# Patient Record
Sex: Female | Born: 1950 | Race: Black or African American | Hispanic: No | Smoking: Never smoker
Health system: Southern US, Community
[De-identification: ages and names within clinical notes are randomized; demographics above are authoritative.]

## PROBLEM LIST (undated history)

## (undated) DIAGNOSIS — J45909 Unspecified asthma, uncomplicated: Secondary | ICD-10-CM

## (undated) DIAGNOSIS — H409 Unspecified glaucoma: Secondary | ICD-10-CM

## (undated) DIAGNOSIS — I10 Essential (primary) hypertension: Secondary | ICD-10-CM

## (undated) DIAGNOSIS — E119 Type 2 diabetes mellitus without complications: Secondary | ICD-10-CM

## (undated) HISTORY — PX: ABDOMINAL HYSTERECTOMY: SHX81

## (undated) HISTORY — DX: Essential (primary) hypertension: I10

## (undated) HISTORY — DX: Unspecified glaucoma: H40.9

## (undated) HISTORY — PX: BACK SURGERY: SHX140

## (undated) HISTORY — PX: FOOT SURGERY: SHX648

## (undated) HISTORY — PX: TONSILLECTOMY: SUR1361

## (undated) HISTORY — DX: Unspecified asthma, uncomplicated: J45.909

## (undated) HISTORY — PX: NECK SURGERY: SHX720

## (undated) HISTORY — DX: Type 2 diabetes mellitus without complications: E11.9

---

## 2002-03-15 ENCOUNTER — Ambulatory Visit (HOSPITAL_COMMUNITY)
Admission: RE | Admit: 2002-03-15 | Discharge: 2002-03-15 | Payer: Self-pay | Admitting: Physical Medicine and Rehabilitation

## 2002-03-15 ENCOUNTER — Encounter: Payer: Self-pay | Admitting: Physical Medicine and Rehabilitation

## 2013-09-23 ENCOUNTER — Ambulatory Visit (INDEPENDENT_AMBULATORY_CARE_PROVIDER_SITE_OTHER): Payer: Medicare Other | Admitting: Podiatrist

## 2013-09-23 ENCOUNTER — Encounter: Payer: Self-pay | Admitting: Podiatrist

## 2013-09-23 VITALS — BP 122/58 | HR 78 | Resp 17 | Ht 64.0 in | Wt 242.0 lb

## 2013-09-23 DIAGNOSIS — E1140 Type 2 diabetes mellitus with diabetic neuropathy, unspecified: Secondary | ICD-10-CM

## 2013-09-23 DIAGNOSIS — E1142 Type 2 diabetes mellitus with diabetic polyneuropathy: Secondary | ICD-10-CM

## 2013-09-23 DIAGNOSIS — M79609 Pain in unspecified limb: Secondary | ICD-10-CM

## 2013-09-23 DIAGNOSIS — M216X9 Other acquired deformities of unspecified foot: Secondary | ICD-10-CM

## 2013-09-23 DIAGNOSIS — E1149 Type 2 diabetes mellitus with other diabetic neurological complication: Secondary | ICD-10-CM

## 2013-09-23 DIAGNOSIS — M21372 Foot drop, left foot: Secondary | ICD-10-CM

## 2013-09-23 DIAGNOSIS — Q828 Other specified congenital malformations of skin: Secondary | ICD-10-CM

## 2013-09-23 DIAGNOSIS — B351 Tinea unguium: Secondary | ICD-10-CM

## 2013-09-23 NOTE — Progress Notes (Signed)
   Subjective:    Patient ID: Christine GianottiLinda H Siordia, female    DOB: February 05, 1951, 63 y.o.   MRN: [id number]  HPI Comments: Pt presents for debridement of B/L 1 - 5 toenails, and hard skin B/L heels and left 5th lateral MPJ, and diabetic foot evaluation. Patient was recently dignosed with diabetes- diet controlled at this time.  She has a foot drop on the left due to a previous surgical complication.  She uses a cane but does not use any assistive footdrop bracing or devices    Review of Systems  HENT: Positive for sinus pressure and tinnitus.   Endocrine: Positive for polyuria.  Musculoskeletal: Positive for back pain.  All other systems reviewed and are negative.      Objective:   Physical Exam GENERAL APPEARANCE: Alert, conversant. Appropriately groomed. No acute distress.  VASCULAR: Pedal pulses palpable at 2/4 DP and PT bilateral.  Capillary refill time is immediate to all digits,  Proximal to distal cooling it warm to warm.  Digital hair growth is present bilateral  NEUROLOGIC: sensation is decreased epicritically and protectively to 5.07 monofilament at 4/5 sites right at 2/5 sites left.  Light touch is intact bilateral, vibratory sensation absent bilateral, achilles tendon reflex is intact bilateral.  MUSCULOSKELETAL: decreased muscle strength is present at 3/5 left with dorsiflexion, plantar flexion, inversion and eversion.  The right has acceptable muscle strength, tone and stability.  Intrinsic muscluature intact bilateral.   DERMATOLOGIC: patients toenails are elongated and she relates they grow into her skin.  She has incurvated borders with pain. skin color, texture, and turger are within normal limits. + preulcerative lesions are present submetatarsal 5 left due to the foot drop, no interdigital maceration noted.  No open lesions present.  Digital nails are asymptomatic.     Assessment & Plan:  Diabetes-- diet controlled;  Ingrowing nail deformity, foot drop,  Plantarflexed 5th  metatarsal right, porokeratosis x 1 Plan:  Nails and lesion debrided sharply without complication. Recommended a spring brace and diabetic shoes with inserts.  She will be seen back in 3 months or as needed for follow up

## 2013-09-23 NOTE — Patient Instructions (Signed)
Diabetes and Foot Care Diabetes may cause you to have problems because of poor blood supply (circulation) to your feet and legs. This may cause the skin on your feet to become thinner, break easier, and heal more slowly. Your skin may become dry, and the skin may peel and crack. You may also have nerve damage in your legs and feet causing decreased feeling in them. You may not notice minor injuries to your feet that could lead to infections or more serious problems. Taking care of your feet is one of the most important things you can do for yourself.  HOME CARE INSTRUCTIONS  Wear shoes at all times, even in the house. Do not go barefoot. Bare feet are easily injured.  Check your feet daily for blisters, cuts, and redness. If you cannot see the bottom of your feet, use a mirror or ask someone for help.  Wash your feet with warm water (do not use hot water) and mild soap. Then pat your feet and the areas between your toes until they are completely dry. Do not soak your feet as this can dry your skin.  Apply a moisturizing lotion or petroleum jelly (that does not contain alcohol and is unscented) to the skin on your feet and to dry, brittle toenails. Do not apply lotion between your toes.  Trim your toenails straight across. Do not dig under them or around the cuticle. File the edges of your nails with an emery board or nail file.  Do not cut corns or calluses or try to remove them with medicine.  Wear clean socks or stockings every day. Make sure they are not too tight. Do not wear knee-high stockings since they may decrease blood flow to your legs.  Wear shoes that fit properly and have enough cushioning. To break in new shoes, wear them for just a few hours a day. This prevents you from injuring your feet. Always look in your shoes before you put them on to be sure there are no objects inside.  Do not cross your legs. This may decrease the blood flow to your feet.  If you find a minor scrape,  cut, or break in the skin on your feet, keep it and the skin around it clean and dry. These areas may be cleansed with mild soap and water. Do not cleanse the area with peroxide, alcohol, or iodine.  When you remove an adhesive bandage, be sure not to damage the skin around it.  If you have a wound, look at it several times a day to make sure it is healing.  Do not use heating pads or hot water bottles. They may burn your skin. If you have lost feeling in your feet or legs, you may not know it is happening until it is too late.  Make sure your health care provider performs a complete foot exam at least annually or more often if you have foot problems. Report any cuts, sores, or bruises to your health care provider immediately. SEEK MEDICAL CARE IF:   You have an injury that is not healing.  You have cuts or breaks in the skin.  You have an ingrown nail.  You notice redness on your legs or feet.  You feel burning or tingling in your legs or feet.  You have pain or cramps in your legs and feet.  Your legs or feet are numb.  Your feet always feel cold. SEEK IMMEDIATE MEDICAL CARE IF:   There is increasing redness,   swelling, or pain in or around a wound.  There is a red line that goes up your leg.  Pus is coming from a wound.  You develop a fever or as directed by your health care provider.  You notice a bad smell coming from an ulcer or wound. Document Released: 05/24/2000 Document Revised: 01/27/2013 Document Reviewed: 11/03/2012 [employer] Patient Information 2014 [employer].  

## 2014-01-06 ENCOUNTER — Ambulatory Visit (INDEPENDENT_AMBULATORY_CARE_PROVIDER_SITE_OTHER): Payer: Medicare Other | Admitting: Podiatrist

## 2014-01-06 ENCOUNTER — Encounter: Payer: Self-pay | Admitting: Podiatrist

## 2014-01-06 VITALS — BP 143/69 | HR 90 | Resp 16 | Ht 64.0 in | Wt 244.0 lb

## 2014-01-06 DIAGNOSIS — M79609 Pain in unspecified limb: Secondary | ICD-10-CM

## 2014-01-06 DIAGNOSIS — M21372 Foot drop, left foot: Secondary | ICD-10-CM

## 2014-01-06 DIAGNOSIS — M216X2 Other acquired deformities of left foot: Secondary | ICD-10-CM

## 2014-01-06 DIAGNOSIS — S8010XA Contusion of unspecified lower leg, initial encounter: Secondary | ICD-10-CM

## 2014-01-06 DIAGNOSIS — E1140 Type 2 diabetes mellitus with diabetic neuropathy, unspecified: Secondary | ICD-10-CM

## 2014-01-06 DIAGNOSIS — E1142 Type 2 diabetes mellitus with diabetic polyneuropathy: Secondary | ICD-10-CM

## 2014-01-06 DIAGNOSIS — B351 Tinea unguium: Secondary | ICD-10-CM

## 2014-01-06 DIAGNOSIS — E1149 Type 2 diabetes mellitus with other diabetic neurological complication: Secondary | ICD-10-CM

## 2014-01-06 DIAGNOSIS — S8012XA Contusion of left lower leg, initial encounter: Secondary | ICD-10-CM

## 2014-01-06 DIAGNOSIS — Q828 Other specified congenital malformations of skin: Secondary | ICD-10-CM

## 2014-01-06 DIAGNOSIS — M216X9 Other acquired deformities of unspecified foot: Secondary | ICD-10-CM

## 2014-01-06 DIAGNOSIS — M79673 Pain in unspecified foot: Secondary | ICD-10-CM

## 2014-01-06 NOTE — Progress Notes (Signed)
Subjective: Christine Lynch presents today for diabetic foot and nail care. She states she had a fall in her car in April and she still has a knot on her shin and she can't wear the spring brace due to the knot. She states that the area is painful but it is not red, swollen or infected.  Physical Exam  GENERAL APPEARANCE: Alert, conversant. Appropriately groomed. No acute distress.  VASCULAR: Pedal pulses palpable at 2/4 DP and PT bilateral. Capillary refill time is immediate to all digits, Proximal to distal cooling it warm to warm. Digital hair growth is present bilateral  NEUROLOGIC: sensation is decreased epicritically and protectively to 5.07 monofilament at 4/5 sites right at 2/5 sites left. Light touch is intact bilateral, vibratory sensation absent bilateral, achilles tendon reflex is intact bilateral.  MUSCULOSKELETAL: decreased muscle strength is present at 3/5 left with dorsiflexion, plantar flexion, inversion and eversion. The right has acceptable muscle strength, tone and stability. Intrinsic muscluature intact bilateral.  DERMATOLOGIC: patients toenails are elongated and she relates they grow into her skin. She has incurvated borders with pain. skin color, texture, and turger are within normal limits. + preulcerative lesions are present submetatarsal 5 left due to the foot drop, no interdigital maceration noted. No open lesions present. Digital nails are asymptomatic. A raised area on her anterior left shin is present consistent with a hematoma deep beneath the skin. It does not look red, swollen, streaking or infected. It is painful with pressure.  Assessment & Plan:   Diabetes-- diet controlled; Ingrowing nail deformity, foot drop, Plantarflexed 5th metatarsal right, porokeratosis x 1 , hematoma left shin Plan: Nails and lesion debrided sharply without complication. Recommended Epsom salts compresses to help with the swelling on the hematoma. She will avoid using the spring brace until this area  goes down. I advised against trying to drain fluid from the area as this would put her at risk for infection. She will be seen back in 3 months or as needed for follow up

## 2014-01-06 NOTE — Progress Notes (Signed)
   Subjective:    Patient ID: Christine Lynch, female    DOB: 18-Jul-1950, 63 y.o.   MRN: [id number]  HPI Comments: Pt presents for debridement of 10 toenails.  Pt states she can not wear her leg brace because of the knot on her left leg, residual from a fall against her car in April 2015.     Review of Systems  All other systems reviewed and are negative.      Objective:   Physical Exam        Assessment & Plan:

## 2014-04-07 ENCOUNTER — Ambulatory Visit (INDEPENDENT_AMBULATORY_CARE_PROVIDER_SITE_OTHER): Payer: Medicare Other | Admitting: Podiatrist

## 2014-04-07 ENCOUNTER — Encounter: Payer: Self-pay | Admitting: Podiatrist

## 2014-04-07 DIAGNOSIS — Q828 Other specified congenital malformations of skin: Secondary | ICD-10-CM

## 2014-04-07 DIAGNOSIS — E1140 Type 2 diabetes mellitus with diabetic neuropathy, unspecified: Secondary | ICD-10-CM

## 2014-04-07 DIAGNOSIS — M216X2 Other acquired deformities of left foot: Secondary | ICD-10-CM

## 2014-04-07 DIAGNOSIS — M79673 Pain in unspecified foot: Secondary | ICD-10-CM

## 2014-04-07 DIAGNOSIS — M21372 Foot drop, left foot: Secondary | ICD-10-CM

## 2014-04-07 DIAGNOSIS — B351 Tinea unguium: Secondary | ICD-10-CM

## 2014-04-11 NOTE — Progress Notes (Signed)
HPI:  Patient presents today for follow up of foot and nail care. Denies any new complaints today.  Objective:  Patients chart is reviewed.  Vascular status reveals pedal pulses noted at 2 out of 4 dp and pt bilateral .  Neurological sensation is impaired to Semmes Weinstein monofilament bilateral.  Patients nails are thickened, discolored, distrophic, friable and brittle with yellow-brown discoloration. Patient subjectively relates they are painful with shoes and with ambulation of bilateral feet.  Assessment:  Symptomatic onychomycosis  Plan:  Discussed treatment options and alternatives.  The symptomatic toenails were debrided through manual an mechanical means without complication.  Return appointment recommended at routine intervals of 3 months    Kadasia Kassing, DPM   

## 2014-06-28 ENCOUNTER — Ambulatory Visit (INDEPENDENT_AMBULATORY_CARE_PROVIDER_SITE_OTHER): Payer: Medicare Other | Admitting: Podiatrist

## 2014-06-28 ENCOUNTER — Encounter: Payer: Self-pay | Admitting: Podiatrist

## 2014-06-28 DIAGNOSIS — Q828 Other specified congenital malformations of skin: Secondary | ICD-10-CM

## 2014-06-28 DIAGNOSIS — B351 Tinea unguium: Secondary | ICD-10-CM

## 2014-06-28 DIAGNOSIS — E1151 Type 2 diabetes mellitus with diabetic peripheral angiopathy without gangrene: Secondary | ICD-10-CM

## 2014-06-28 DIAGNOSIS — M79676 Pain in unspecified toe(s): Secondary | ICD-10-CM

## 2014-06-28 NOTE — Patient Instructions (Signed)
Diabetes and Foot Care Diabetes may cause you to have problems because of poor blood supply (circulation) to your feet and legs. This may cause the skin on your feet to become thinner, break easier, and heal more slowly. Your skin may become dry, and the skin may peel and crack. You may also have nerve damage in your legs and feet causing decreased feeling in them. You may not notice minor injuries to your feet that could lead to infections or more serious problems. Taking care of your feet is one of the most important things you can do for yourself.  HOME CARE INSTRUCTIONS  Wear shoes at all times, even in the house. Do not go barefoot. Bare feet are easily injured.  Check your feet daily for blisters, cuts, and redness. If you cannot see the bottom of your feet, use a mirror or ask someone for help.  Wash your feet with warm water (do not use hot water) and mild soap. Then pat your feet and the areas between your toes until they are completely dry. Do not soak your feet as this can dry your skin.  Apply a moisturizing lotion or petroleum jelly (that does not contain alcohol and is unscented) to the skin on your feet and to dry, brittle toenails. Do not apply lotion between your toes.  Trim your toenails straight across. Do not dig under them or around the cuticle. File the edges of your nails with an emery board or nail file.  Do not cut corns or calluses or try to remove them with medicine.  Wear clean socks or stockings every day. Make sure they are not too tight. Do not wear knee-high stockings since they may decrease blood flow to your legs.  Wear shoes that fit properly and have enough cushioning. To break in new shoes, wear them for just a few hours a day. This prevents you from injuring your feet. Always look in your shoes before you put them on to be sure there are no objects inside.  Do not cross your legs. This may decrease the blood flow to your feet.  If you find a minor scrape,  cut, or break in the skin on your feet, keep it and the skin around it clean and dry. These areas may be cleansed with mild soap and water. Do not cleanse the area with peroxide, alcohol, or iodine.  When you remove an adhesive bandage, be sure not to damage the skin around it.  If you have a wound, look at it several times a day to make sure it is healing.  Do not use heating pads or hot water bottles. They may burn your skin. If you have lost feeling in your feet or legs, you may not know it is happening until it is too late.  Make sure your health care provider performs a complete foot exam at least annually or more often if you have foot problems. Report any cuts, sores, or bruises to your health care provider immediately. SEEK MEDICAL CARE IF:   You have an injury that is not healing.  You have cuts or breaks in the skin.  You have an ingrown nail.  You notice redness on your legs or feet.  You feel burning or tingling in your legs or feet.  You have pain or cramps in your legs and feet.  Your legs or feet are numb.  Your feet always feel cold. SEEK IMMEDIATE MEDICAL CARE IF:   There is increasing redness,   swelling, or pain in or around a wound.  There is a red line that goes up your leg.  Pus is coming from a wound.  You develop a fever or as directed by your health care provider.  You notice a bad smell coming from an ulcer or wound. Document Released: 05/24/2000 Document Revised: 01/27/2013 Document Reviewed: 11/03/2012 [employer] Patient Information 2015 [employer]. This information is not intended to replace advice given to you by your health care provider. Make sure you discuss any questions you have with your health care provider.  

## 2014-07-07 ENCOUNTER — Ambulatory Visit: Payer: Medicare Other | Admitting: Podiatrist

## 2014-07-07 NOTE — Progress Notes (Signed)
HPI:  Patient presents today for follow up of foot and nail care. Denies any new complaints today.  Objective:  Patients chart is reviewed.  Vascular status reveals pedal pulses noted at 2 out of 4 dp and pt bilateral .  Neurological sensation is impaired to Semmes Weinstein monofilament bilateral.  Patients nails are thickened, discolored, distrophic, friable and brittle with yellow-brown discoloration. Patient subjectively relates they are painful with shoes and with ambulation of bilateral feet.  Assessment:  Symptomatic onychomycosis  Plan:  Discussed treatment options and alternatives.  The symptomatic toenails were debrided through manual an mechanical means without complication.  Return appointment recommended at routine intervals of 3 months    Ezzie Senat, DPM   

## 2014-10-07 ENCOUNTER — Ambulatory Visit: Payer: Medicare Other

## 2014-10-07 DIAGNOSIS — B351 Tinea unguium: Secondary | ICD-10-CM

## 2014-10-07 DIAGNOSIS — E1140 Type 2 diabetes mellitus with diabetic neuropathy, unspecified: Secondary | ICD-10-CM

## 2014-10-07 DIAGNOSIS — M79673 Pain in unspecified foot: Secondary | ICD-10-CM

## 2014-10-07 DIAGNOSIS — M79676 Pain in unspecified toe(s): Principal | ICD-10-CM

## 2014-10-11 NOTE — Progress Notes (Signed)
HPI:  Patient presents today for follow up of foot and nail care. Denies any new complaints today.  Objective:  Patients chart is reviewed.  Vascular status reveals pedal pulses noted at 2 out of 4 dp and pt bilateral .  Neurological sensation is impaired to [facility] monofil[facility] bilateral.  Patients nails are thickened, discolored, distrophic, friable and brittle with yellow-brown discoloration. Patient subjectively relates they are painful with shoes and with ambulation of bilateral feet.  Assessment:  Symptomatic onychomycosis  Plan:  Discussed treatment options and alternatives.  The symptomatic toenails were debrided through manual an mechanical means without complication.  Return appointment recommended at routine intervals of 3 months

## 2015-01-11 ENCOUNTER — Ambulatory Visit (INDEPENDENT_AMBULATORY_CARE_PROVIDER_SITE_OTHER): Payer: Medicare Other | Admitting: Podiatry

## 2015-01-11 ENCOUNTER — Encounter: Payer: Self-pay | Admitting: Podiatry

## 2015-01-11 DIAGNOSIS — E1140 Type 2 diabetes mellitus with diabetic neuropathy, unspecified: Secondary | ICD-10-CM

## 2015-01-11 DIAGNOSIS — M79673 Pain in unspecified foot: Secondary | ICD-10-CM

## 2015-01-11 DIAGNOSIS — B351 Tinea unguium: Secondary | ICD-10-CM | POA: Diagnosis not present

## 2015-01-11 NOTE — Patient Instructions (Signed)
Diabetes and Foot Care Diabetes may cause you to have problems because of poor blood supply (circulation) to your feet and legs. This may cause the skin on your feet to become thinner, break easier, and heal more slowly. Your skin may become dry, and the skin may peel and crack. You may also have nerve damage in your legs and feet causing decreased feeling in them. You may not notice minor injuries to your feet that could lead to infections or more serious problems. Taking care of your feet is one of the most important things you can do for yourself.  HOME CARE INSTRUCTIONS  Wear shoes at all times, even in the house. Do not go barefoot. Bare feet are easily injured.  Check your feet daily for blisters, cuts, and redness. If you cannot see the bottom of your feet, use a mirror or ask someone for help.  Wash your feet with warm water (do not use hot water) and mild soap. Then pat your feet and the areas between your toes until they are completely dry. Do not soak your feet as this can dry your skin.  Apply a moisturizing lotion or petroleum jelly (that does not contain alcohol and is unscented) to the skin on your feet and to dry, brittle toenails. Do not apply lotion between your toes.  Trim your toenails straight across. Do not dig under them or around the cuticle. File the edges of your nails with an emery board or nail file.  Do not cut corns or calluses or try to remove them with medicine.  Wear clean socks or stockings every day. Make sure they are not too tight. Do not wear knee-high stockings since they may decrease blood flow to your legs.  Wear shoes that fit properly and have enough cushioning. To break in new shoes, wear them for just a few hours a day. This prevents you from injuring your feet. Always look in your shoes before you put them on to be sure there are no objects inside.  Do not cross your legs. This may decrease the blood flow to your feet.  If you find a minor scrape,  cut, or break in the skin on your feet, keep it and the skin around it clean and dry. These areas may be cleansed with mild soap and water. Do not cleanse the area with peroxide, alcohol, or iodine.  When you remove an adhesive bandage, be sure not to damage the skin around it.  If you have a wound, look at it several times a day to make sure it is healing.  Do not use heating pads or hot water bottles. They may burn your skin. If you have lost feeling in your feet or legs, you may not know it is happening until it is too late.  Make sure your health care provider performs a complete foot exam at least annually or more often if you have foot problems. Report any cuts, sores, or bruises to your health care provider immediately. SEEK MEDICAL CARE IF:   You have an injury that is not healing.  You have cuts or breaks in the skin.  You have an ingrown nail.  You notice redness on your legs or feet.  You feel burning or tingling in your legs or feet.  You have pain or cramps in your legs and feet.  Your legs or feet are numb.  Your feet always feel cold. SEEK IMMEDIATE MEDICAL CARE IF:   There is increasing redness,   swelling, or pain in or around a wound.  There is a red line that goes up your leg.  Pus is coming from a wound.  You develop a fever or as directed by your health care provider.  You notice a bad smell coming from an ulcer or wound. Document Released: 05/24/2000 Document Revised: 01/27/2013 Document Reviewed: 11/03/2012 [employer] Patient Information 2015 [employer]. This information is not intended to replace advice given to you by your health care provider. Make sure you discuss any questions you have with your health care provider.  

## 2015-01-12 NOTE — Progress Notes (Signed)
Patient ID: Christine Lynch, female   DOB: 1951-05-31, 64 y.o.   MRN: [id number]  Subjective: This patient presents today requesting debridement of mycotic toenails.  Objective: The toenails are discolored, hypertrophic, brittle, there is no surrounding erythema, warmth, drainage surrounding the nail plates  Assessment: Mycotic toenails 6-10 Diabetic with a history of neuropathy  Plan: Debridement toenails mechanically and electrical without a bleeding  Reappoint at three-month intervals

## 2015-07-17 ENCOUNTER — Ambulatory Visit (INDEPENDENT_AMBULATORY_CARE_PROVIDER_SITE_OTHER): Payer: Medicare Other | Admitting: Podiatry

## 2015-07-17 DIAGNOSIS — M79673 Pain in unspecified foot: Secondary | ICD-10-CM | POA: Diagnosis not present

## 2015-07-17 DIAGNOSIS — E1140 Type 2 diabetes mellitus with diabetic neuropathy, unspecified: Secondary | ICD-10-CM

## 2015-07-17 DIAGNOSIS — B351 Tinea unguium: Secondary | ICD-10-CM | POA: Diagnosis not present

## 2015-07-18 NOTE — Progress Notes (Signed)
Patient ID: Christine Lynch, female   DOB: June 14, 1950, 65 y.o.   MRN: [id number]  Subjective: 65 y.o. returns the office today for painful, elongated, thickened toenails which she cannot trim herself. Denies any redness or drainage around the nails. Denies any acute changes since last appointment and no new complaints today. Denies any systemic complaints such as fevers, chills, nausea, vomiting.   Objective: AAO 3, NAD DP/PT pulses palpable, CRT less than 3 seconds Nails hypertrophic, dystrophic, elongated, brittle, discolored 10. There is tenderness overlying the nails 1-5 bilaterally. There is no surrounding erythema or drainage along the nail sites. No open lesions or pre-ulcerative lesions are identified. No other areas of tenderness bilateral lower extremities. No overlying edema, erythema, increased warmth. No pain with calf compression, swelling, warmth, erythema.  Assessment: Patient presents with symptomatic onychomycosis  Plan: -Treatment options including alternatives, risks, complications were discussed -Nails sharply debrided 10 without complication/bleeding. -Discussed daily foot inspection. If there are any changes, to call the office immediately.  -Follow-up in 3 months or sooner if any problems are to arise. In the meantime, encouraged to call the office with any questions, concerns, changes symptoms.  Ovid Curd, DPM

## 2015-10-16 ENCOUNTER — Ambulatory Visit: Payer: Medicare Other | Admitting: Sports Medicine

## 2019-04-02 ENCOUNTER — Other Ambulatory Visit: Payer: Self-pay

## 2019-04-02 ENCOUNTER — Encounter (HOSPITAL_BASED_OUTPATIENT_CLINIC_OR_DEPARTMENT_OTHER): Payer: Self-pay | Admitting: Emergency Medicine

## 2019-04-02 ENCOUNTER — Emergency Department (HOSPITAL_BASED_OUTPATIENT_CLINIC_OR_DEPARTMENT_OTHER)
Admission: EM | Admit: 2019-04-02 | Discharge: 2019-04-02 | Disposition: A | Discharge status: Home / Self Care | Payer: Medicare HMO | Attending: Emergency Medicine | Admitting: Emergency Medicine

## 2019-04-02 DIAGNOSIS — Z885 Allergy status to narcotic agent status: Secondary | ICD-10-CM | POA: Diagnosis not present

## 2019-04-02 DIAGNOSIS — Z7984 Long term (current) use of oral hypoglycemic drugs: Secondary | ICD-10-CM | POA: Diagnosis not present

## 2019-04-02 DIAGNOSIS — E119 Type 2 diabetes mellitus without complications: Secondary | ICD-10-CM | POA: Diagnosis not present

## 2019-04-02 DIAGNOSIS — Z888 Allergy status to other drugs, medicaments and biological substances status: Secondary | ICD-10-CM | POA: Diagnosis not present

## 2019-04-02 DIAGNOSIS — I10 Essential (primary) hypertension: Secondary | ICD-10-CM | POA: Insufficient documentation

## 2019-04-02 DIAGNOSIS — K0889 Other specified disorders of teeth and supporting structures: Secondary | ICD-10-CM | POA: Diagnosis present

## 2019-04-02 DIAGNOSIS — J45909 Unspecified asthma, uncomplicated: Secondary | ICD-10-CM | POA: Diagnosis not present

## 2019-04-02 DIAGNOSIS — K068 Other specified disorders of gingiva and edentulous alveolar ridge: Secondary | ICD-10-CM

## 2019-04-02 DIAGNOSIS — Z79899 Other long term (current) drug therapy: Secondary | ICD-10-CM | POA: Diagnosis not present

## 2019-04-02 LAB — GROUP A STREP BY PCR: Group A Strep by PCR: NOT DETECTED

## 2019-04-02 NOTE — ED Triage Notes (Addendum)
Pain in upper gums since yesterday morning.  Pt has dentures. Today has swelling to face.

## 2019-04-02 NOTE — Discharge Instructions (Addendum)
Please take Tylenol (acetaminophen) to relieve your pain.  You may take tylenol, up to 1,000 mg (two extra strength pills).  Do not take more than 3,000 mg tylenol in a 24 hour period.  Please check all medication labels as many medications such as pain and cold medications may contain tylenol. Please do not drink alcohol while taking this medication.  ° °

## 2019-04-02 NOTE — ED Provider Notes (Signed)
[facility] EMERGENCY DEPARTMENT Provider Note   CSN: [id number] Arrival date & time: 04/02/19  1703     History   Chief Complaint Chief Complaint  Patient presents with  . Upper Gums pain    HPI Christine Lynch is a 68 y.o. female with a past medical history of asthma, hypertension, diabetes, who presents today for evaluation of sore throat and pain in her upper gums since yesterday morning. She reports that she attempted to go to a dentist today, however they required almost a $200 co-pay which she was unable to afford.  She states that she has not changed her denture cleaning solution or denture paste.  She has not had any adjustments to her dentures recently.  She states that she had a sore throat, however that has resolved.  She denies any cough or shortness of breath.  She states that her face is mildly swollen bilaterally.  She denies any new exposures.     HPI  Past Medical History:  Diagnosis Date  . Asthma   . Diabetes mellitus without complication (HCC)   . Glaucoma   . Hypertension     There are no active problems to display for this patient.   Past Surgical History:  Procedure Laterality Date  . ABDOMINAL HYSTERECTOMY    . BACK SURGERY    . FOOT SURGERY Left    repair of drop foot  . NECK SURGERY    . TONSILLECTOMY       OB History   No obstetric history on file.      Home Medications    Prior to Admission medications   Medication Sig Start Date End Date Taking? Authorizing Provider  cyclobenzaprine (FLEXERIL) 10 MG tablet Take by mouth. 03/23/19  Yes [provider]  albuterol (PROVENTIL) (2.5 MG/3ML) 0.083% nebulizer solution  01/11/19   [provider]  amLODipine (NORVASC) 5 MG tablet Take 5 mg by mouth daily. 03/09/19   [provider]  diclofenac sodium (VOLTAREN) 1 % GEL  11/18/18   [provider]  dorzolamide (TRUSOPT) 2 % ophthalmic solution Place 1 drop into both eyes 2 (two) times daily.     [provider]  furosemide (LASIX) 40 MG tablet Take 40 mg by mouth.    [provider]  metFORMIN (GLUCOPHAGE) 500 MG tablet  03/27/19   [provider]  mometasone (NASONEX) 50 MCG/ACT nasal spray Place 2 sprays into the nose daily.    [provider]  mupirocin ointment (BACTROBAN) 2 % Place 1 application into the nose 2 (two) times daily.    [provider]  Oxycodone HCl 10 MG TABS  03/16/19   [provider]  pantoprazole (PROTONIX) 40 MG tablet Take 40 mg by mouth daily.    [provider]  potassium chloride (K-DUR) 10 MEQ tablet Take 10 mEq by mouth daily.    [provider]  valsartan (DIOVAN) 80 MG tablet Take 80 mg by mouth daily. 03/11/19   [provider]    Family History No family history on file.  Social History Social History   Tobacco Use  . Smoking status: Never Smoker  . Smokeless tobacco: Never Used  Substance Use Topics  . Alcohol use: Never    Frequency: Never  . Drug use: Never     Allergies   Baclofen, Opana [oxymorphone], and Oxycodone   Review of Systems Review of Systems  Constitutional: Negative for chills and fever.  HENT: Positive for dental problem,  facial swelling, mouth sores and sore throat. Negative for postnasal drip.   Eyes: Negative for visual disturbance.  Respiratory: Negative for cough.   Cardiovascular: Negative for chest pain.  Gastrointestinal: Negative for abdominal pain, diarrhea, nausea and vomiting.  Genitourinary: Negative for dysuria.  Musculoskeletal: Negative for back pain and neck pain.  Neurological: Negative for weakness and headaches.  All other systems reviewed and are negative.    Physical Exam Updated Vital Signs BP (!) 162/71 (BP Location: Right Arm)   Pulse 93   Temp 98.3 F (36.8 C) (Oral)   Resp 16   Ht 5\' 6"  (1.676 m)   Wt 90.3 kg   SpO2 100%   BMI 32.12 kg/m   Physical Exam Vitals signs and nursing note reviewed.   Constitutional:      General: She is not in acute distress.    Appearance: She is not ill-appearing.  HENT:     Head: Normocephalic.     Nose: Nose normal.     Mouth/Throat:     Lips: Pink. No lesions.     Mouth: Mucous membranes are moist. Oral lesions present. No lacerations or angioedema.     Dentition: Has dentures. Gum lesions present. No dental abscesses.     Pharynx: Uvula midline. Oropharyngeal exudate and posterior oropharyngeal erythema present.     Tonsils: No tonsillar exudate or tonsillar abscesses. 2+ on the right. 2+ on the left.     Comments: Patient has full upper and lower dentures.  There are multiple shallow ulcerations on the roof of the mouth with petechiae on the posterior palate. Eyes:     Conjunctiva/sclera: Conjunctivae normal.  Neck:     Musculoskeletal: Normal range of motion and neck supple.  Cardiovascular:     Rate and Rhythm: Normal rate.  Pulmonary:     Effort: Pulmonary effort is normal. No respiratory distress.  Neurological:     Mental Status: She is alert.  Psychiatric:        Mood and Affect: Mood normal.      ED Treatments / Results  Labs (all labs ordered are listed, but only abnormal results are displayed) Labs Reviewed  GROUP A STREP BY PCR    EKG None  Radiology No results found.  Procedures Procedures (including critical care time)  Medications Ordered in ED Medications - No data to display   Initial Impression / Assessment and Plan / ED Course  I have reviewed the triage vital signs and the nursing notes.  Pertinent labs & imaging results that were available during my care of the patient were reviewed by me and considered in my medical decision making (see chart for details).       Patient presents today for evaluation of oral pain and sore throat.  She was recently seen by her primary care doctor and tested negative for strep.  On exam she has multiple shallow ulceration to the roof of her mouth with enlarged  tonsils showing patchy exudates bilaterally.  Suspicion for strep throat vs oral ulcerations.    Strep test ordered based on high clinical suspicion.    At shift change care was transferred to Dr. Rogene Houston  who will follow pending studies, re-evaulate and determine disposition.     Final Clinical Impressions(s) / ED Diagnoses   Final diagnoses:  Pain in gums    ED Discharge Orders    None       Ollen Gross 04/02/19 1820    Fredia Sorrow, MD 04/02/19 2028

## 2019-04-02 NOTE — ED Notes (Signed)
ED Provider at bedside. 

## 2020-03-29 ENCOUNTER — Other Ambulatory Visit (HOSPITAL_BASED_OUTPATIENT_CLINIC_OR_DEPARTMENT_OTHER): Payer: Self-pay | Admitting: Emergency Medicine

## 2020-03-29 ENCOUNTER — Other Ambulatory Visit: Payer: Self-pay

## 2020-03-29 ENCOUNTER — Emergency Department (HOSPITAL_BASED_OUTPATIENT_CLINIC_OR_DEPARTMENT_OTHER)
Admission: EM | Admit: 2020-03-29 | Discharge: 2020-03-29 | Disposition: A | Discharge status: Home / Self Care | Payer: Medicare HMO | Attending: Emergency Medicine | Admitting: Emergency Medicine

## 2020-03-29 ENCOUNTER — Emergency Department (HOSPITAL_BASED_OUTPATIENT_CLINIC_OR_DEPARTMENT_OTHER): Payer: Medicare HMO

## 2020-03-29 ENCOUNTER — Telehealth (HOSPITAL_BASED_OUTPATIENT_CLINIC_OR_DEPARTMENT_OTHER): Payer: Self-pay | Admitting: Emergency Medicine

## 2020-03-29 ENCOUNTER — Encounter (HOSPITAL_BASED_OUTPATIENT_CLINIC_OR_DEPARTMENT_OTHER): Payer: Self-pay

## 2020-03-29 DIAGNOSIS — M79675 Pain in left toe(s): Secondary | ICD-10-CM

## 2020-03-29 DIAGNOSIS — I10 Essential (primary) hypertension: Secondary | ICD-10-CM | POA: Diagnosis not present

## 2020-03-29 DIAGNOSIS — Z7984 Long term (current) use of oral hypoglycemic drugs: Secondary | ICD-10-CM | POA: Insufficient documentation

## 2020-03-29 DIAGNOSIS — Z79899 Other long term (current) drug therapy: Secondary | ICD-10-CM | POA: Diagnosis not present

## 2020-03-29 DIAGNOSIS — R52 Pain, unspecified: Secondary | ICD-10-CM

## 2020-03-29 DIAGNOSIS — E1139 Type 2 diabetes mellitus with other diabetic ophthalmic complication: Secondary | ICD-10-CM | POA: Diagnosis not present

## 2020-03-29 DIAGNOSIS — J45909 Unspecified asthma, uncomplicated: Secondary | ICD-10-CM | POA: Diagnosis not present

## 2020-03-29 MED ORDER — CEPHALEXIN 500 MG PO CAPS: 500.0000 mg | ORAL_CAPSULE | Freq: Four times a day (QID) | ORAL | 0 refills | Status: AC

## 2020-03-29 MED ORDER — SULFAMETHOXAZOLE-TRIMETHOPRIM 800-160 MG PO TABS: 1.0000 | ORAL_TABLET | Freq: Two times a day (BID) | ORAL | 0 refills | Status: DC

## 2020-03-29 MED FILL — SULFAMETHOXAZOLE-TMP DS TAB: 800-160 | 5 days supply | Qty: 10 | Fill #0

## 2020-03-29 NOTE — ED Provider Notes (Signed)
[facility] EMERGENCY DEPARTMENT Provider Note   CSN: [id number] Arrival date & time: 03/29/20  1319     History Chief Complaint  Patient presents with  . Toe Pain    Christine Lynch is a 69 y.o. female.   Toe Pain This is a new problem. The current episode started more than 2 days ago. The problem occurs constantly. The problem has not changed since onset.Pertinent negatives include no chest pain, no headaches and no shortness of breath. Exacerbated by: bearing weight, movement and palpation. Nothing relieves the symptoms. She has tried nothing for the symptoms. The treatment provided no relief.       Past Medical History:  Diagnosis Date  . Asthma   . Diabetes mellitus without complication (HCC)   . Glaucoma   . Hypertension     There are no problems to display for this patient.   Past Surgical History:  Procedure Laterality Date  . ABDOMINAL HYSTERECTOMY    . BACK SURGERY    . FOOT SURGERY Left    repair of drop foot  . NECK SURGERY    . TONSILLECTOMY       OB History   No obstetric history on file.     No family history on file.  Social History   Tobacco Use  . Smoking status: Never Smoker  . Smokeless tobacco: Never Used  Substance Use Topics  . Alcohol use: Never  . Drug use: Never    Home Medications Prior to Admission medications   Medication Sig Start Date End Date Taking? Authorizing Provider  albuterol (PROVENTIL) (2.5 MG/3ML) 0.083% nebulizer solution  01/11/19   [provider]  amLODipine (NORVASC) 5 MG tablet Take 5 mg by mouth daily. 03/09/19   [provider]  cyclobenzaprine (FLEXERIL) 10 MG tablet Take by mouth. 03/23/19   [provider]  diclofenac sodium (VOLTAREN) 1 % GEL  11/18/18   [provider]  dorzolamide (TRUSOPT) 2 % ophthalmic solution Place 1 drop into both eyes 2 (two) times daily.    [provider]  furosemide (LASIX) 40 MG tablet Take 40 mg by mouth.     [provider]  metFORMIN (GLUCOPHAGE) 500 MG tablet  03/27/19   [provider]  mometasone (NASONEX) 50 MCG/ACT nasal spray Place 2 sprays into the nose daily.    [provider]  mupirocin ointment (BACTROBAN) 2 % Place 1 application into the nose 2 (two) times daily.    [provider]  Oxycodone HCl 10 MG TABS  03/16/19   [provider]  pantoprazole (PROTONIX) 40 MG tablet Take 40 mg by mouth daily.    [provider]  potassium chloride (K-DUR) 10 MEQ tablet Take 10 mEq by mouth daily.    [provider]  sulfamethoxazole-trimethoprim (BACTRIM DS) 800-160 MG tablet Take 1 tablet by mouth 2 (two) times daily for 5 days. 03/29/20 04/03/20  [name], MD  valsartan (DIOVAN) 80 MG tablet Take 80 mg by mouth daily. 03/11/19   [provider]    Allergies    Baclofen and Opana [oxymorphone]  Review of Systems   Review of Systems  Constitutional: Negative for chills and fever.  HENT: Negative for congestion and rhinorrhea.   Respiratory: Negative for cough and shortness of breath.   Cardiovascular: Negative for chest pain and palpitations.  Gastrointestinal: Negative for diarrhea, nausea and vomiting.  Genitourinary: Negative for difficulty urinating and dysuria.  Musculoskeletal: Positive for arthralgias and joint swelling.  Negative for back pain.  Skin: Negative for rash and wound.  Neurological: Negative for light-headedness and headaches.    Physical Exam Updated Vital Signs BP (!) 141/70 (BP Location: Left Arm)   Pulse 92   Temp 98.5 F (36.9 C) (Oral)   Resp 18   Ht 5\' 5"  (1.651 m)   Wt 98.9 kg   SpO2 99%   BMI 36.28 kg/m   Physical Exam Vitals and nursing note reviewed. Exam conducted with a chaperone present.  Constitutional:      General: She is not in acute distress.    Appearance: Normal appearance.  HENT:     Head: Normocephalic and atraumatic.     Nose: No rhinorrhea.  Eyes:      General:        Right eye: No discharge.        Left eye: No discharge.     Conjunctiva/sclera: Conjunctivae normal.  Cardiovascular:     Rate and Rhythm: Normal rate and regular rhythm.  Pulmonary:     Effort: Pulmonary effort is normal. No respiratory distress.     Breath sounds: No stridor.  Abdominal:     General: Abdomen is flat. There is no distension.     Palpations: Abdomen is soft.  Musculoskeletal:        General: No tenderness or signs of injury.     Comments: Left great toe with the toenail removed, mild erythema at the distalmost portion of the great toe, no erythema of the joint tenderness to palpation, no warmth no red streaking intact pulse on the foot and intact sensation  Skin:    General: Skin is warm and dry.  Neurological:     General: No focal deficit present.     Mental Status: She is alert. Mental status is at baseline.     Motor: No weakness.  Psychiatric:        Mood and Affect: Mood normal.        Behavior: Behavior normal.     ED Results / Procedures / Treatments   Labs (all labs ordered are listed, but only abnormal results are displayed) Labs Reviewed - No data to display  EKG None  Radiology DG Toe Great Left  Result Date: 03/29/2020 CLINICAL DATA:  Left great toe pain EXAM: LEFT GREAT TOE COMPARISON:  None. FINDINGS: Three view radiograph right great toe demonstrates normal alignment. No fracture or dislocation. Mild degenerative throat is involving the interphalangeal joint. MTP joint space preserved. No osseous erosion. Soft tissues are unremarkable. IMPRESSION: No acute fracture or dislocation. Electronically Signed   By: 03/31/2020 MD   On: 03/29/2020 14:32    Procedures Procedures (including critical care time)  Medications Ordered in ED Medications - No data to display  ED Course  I have reviewed the triage vital signs and the nursing notes.  Pertinent labs & imaging results that were available during my care of the patient  were reviewed by me and considered in my medical decision making (see chart for details).    MDM Rules/Calculators/A&P                          39-year-old female was sent to our facility by her podiatrist for evaluation.  She has been having worsening pain in her great toe.  She had the toenail removed as part of her podiatry work-up and evaluation this does not help.  She denies fevers chills.  She denies a  history of gout, on exam it does not appear there is significant signs concerning for septic arthritis or gout, some mild erythema at the distalmost portion of the toe.  I do not feel that she needs arthrocentesis at this time, I recommend outpatient follow-up with her podiatrist, x-ray was performed here to look for bony changes regarding injury or infection and after reviewed by radiology myself there are none.  I think she is safe for outpatient management will start on a course of antibiotics in case there is an overlying cellulitis of the distal portion of the toe. Final Clinical Impression(s) / ED Diagnoses Final diagnoses:  Great toe pain, left    Rx / DC Orders ED Discharge Orders         Ordered    sulfamethoxazole-trimethoprim (BACTRIM DS) 800-160 MG tablet  2 times daily        03/29/20 1503           [name], MD 03/29/20 [phone]

## 2020-03-29 NOTE — Discharge Instructions (Addendum)
Wear the postop shoe, follow-up with the podiatrist right away, return with fever, pain that you cannot tolerate and red streaking up the foot

## 2020-03-29 NOTE — ED Notes (Signed)
X-ray at bedside

## 2020-03-29 NOTE — Telephone Encounter (Signed)
Patient prescribed Bactrim, found out that she is allergic to sulfa drugs.  Will replace with Keflex.  Elmer Sow. Pilar Plate, MD [facility] [email or web address]

## 2020-03-29 NOTE — ED Triage Notes (Signed)
Pt c/o pain to left great toe pain-states she recently had toenail removed by podiatrist-states she was advised to come to ED-NAD-steady/slow gait

## 2022-03-22 ENCOUNTER — Other Ambulatory Visit: Payer: Self-pay

## 2022-03-22 ENCOUNTER — Emergency Department (HOSPITAL_BASED_OUTPATIENT_CLINIC_OR_DEPARTMENT_OTHER): Payer: Medicare HMO

## 2022-03-22 ENCOUNTER — Emergency Department (HOSPITAL_BASED_OUTPATIENT_CLINIC_OR_DEPARTMENT_OTHER)
Admission: EM | Admit: 2022-03-22 | Discharge: 2022-03-22 | Disposition: A | Discharge status: Home / Self Care | Payer: Medicare HMO | Attending: Emergency Medicine | Admitting: Emergency Medicine

## 2022-03-22 ENCOUNTER — Encounter (HOSPITAL_BASED_OUTPATIENT_CLINIC_OR_DEPARTMENT_OTHER): Payer: Self-pay

## 2022-03-22 DIAGNOSIS — Z20822 Contact with and (suspected) exposure to covid-19: Secondary | ICD-10-CM | POA: Insufficient documentation

## 2022-03-22 DIAGNOSIS — R0981 Nasal congestion: Secondary | ICD-10-CM | POA: Diagnosis present

## 2022-03-22 DIAGNOSIS — J069 Acute upper respiratory infection, unspecified: Secondary | ICD-10-CM | POA: Diagnosis not present

## 2022-03-22 LAB — SARS CORONAVIRUS 2 BY RT PCR: SARS Coronavirus 2 by RT PCR: NEGATIVE

## 2022-03-22 NOTE — Discharge Instructions (Signed)
If you develop high fever, severe cough or cough with blood, trouble breathing, severe headache, neck pain/stiffness, vomiting, or any other new/concerning symptoms then return to the ER for evaluation  

## 2022-03-22 NOTE — ED Provider Notes (Signed)
[place] EMERGENCY DEPARTMENT Provider Note   CSN: [id number] Arrival date & time: 03/22/22  1510     History  Chief Complaint  Patient presents with   URI    Christine Lynch is a 71 y.o. female.  HPI 71 year old female presents with nasal congestion and cough.  Symptoms originally started on 10/9, primarily with nasal congestion.  Her congestion is clear. No sore throat.  No fevers up until today when she had a temp of 99.  She is also developed a cough and feels like the congestion has moved into her chest.  However there is no chest pain, headache, sore throat, shortness of breath.  No weakness or numbness.  She has tried some over-the-counter meds including Alka-Seltzer, NyQuil.  Her BP is high today, but she states she didn't take her meds until about 3 pm today and hasn't taken her chronic opiates and thinks this is also driving up her BP.  Home Medications Prior to Admission medications   Medication Sig Start Date End Date Taking? Authorizing Provider  albuterol (PROVENTIL) (2.5 MG/3ML) 0.083% nebulizer solution  01/11/19   [provider]  amLODipine (NORVASC) 5 MG tablet Take 5 mg by mouth daily. 03/09/19   [provider]  cyclobenzaprine (FLEXERIL) 10 MG tablet Take by mouth. 03/23/19   [provider]  diclofenac sodium (VOLTAREN) 1 % GEL  11/18/18   [provider]  dorzolamide (TRUSOPT) 2 % ophthalmic solution Place 1 drop into both eyes 2 (two) times daily.    [provider]  furosemide (LASIX) 40 MG tablet Take 40 mg by mouth.    [provider]  metFORMIN (GLUCOPHAGE) 500 MG tablet  03/27/19   [provider]  mometasone (NASONEX) 50 MCG/ACT nasal spray Place 2 sprays into the nose daily.    [provider]  mupirocin ointment (BACTROBAN) 2 % Place 1 application into the nose 2 (two) times daily.    [provider]  Oxycodone HCl 10 MG TABS  03/16/19   [provider]   pantoprazole (PROTONIX) 40 MG tablet Take 40 mg by mouth daily.    [provider]  potassium chloride (K-DUR) 10 MEQ tablet Take 10 mEq by mouth daily.    [provider]  valsartan (DIOVAN) 80 MG tablet Take 80 mg by mouth daily. 03/11/19   [provider]      Allergies    Fentanyl, Oxymorphone hcl, Amoxicillin-pot clavulanate, Sulfamethoxazole-trimethoprim, Baclofen, Opana [oxymorphone], Promethazine, Ibuprofen, and Sulfamethoxazole    Review of Systems   Review of Systems  Constitutional:  Negative for fever.  HENT:  Negative for sore throat.   Respiratory:  Positive for cough. Negative for shortness of breath.   Cardiovascular:  Negative for chest pain and leg swelling.  Gastrointestinal:  Negative for abdominal pain.  Neurological:  Negative for weakness, numbness and headaches.    Physical Exam Updated Vital Signs BP (!) 159/75   Pulse 87   Temp 99.3 F (37.4 C) (Oral)   Resp 19   Ht 5\' 6"  (1.676 m)   Wt 98.4 kg   SpO2 99%   BMI 35.02 kg/m  Physical Exam Vitals and nursing note reviewed.  Constitutional:      General: She is not in acute distress.    Appearance: She is well-developed. She is not ill-appearing or diaphoretic.  HENT:     Head: Normocephalic and atraumatic.  Cardiovascular:     Rate and Rhythm: Normal rate and regular rhythm.  Heart sounds: Normal heart sounds.  Pulmonary:     Effort: Pulmonary effort is normal. No tachypnea or accessory muscle usage.     Breath sounds: Normal breath sounds. No wheezing or rales.  Abdominal:     General: There is no distension.  Skin:    General: Skin is warm and dry.  Neurological:     Mental Status: She is alert.     ED Results / Procedures / Treatments   Labs (all labs ordered are listed, but only abnormal results are displayed) Labs Reviewed  SARS CORONAVIRUS 2 BY RT PCR    EKG None  Radiology DG Chest 2 View  Result Date: 03/22/2022 CLINICAL DATA:  Coughing  congestion. EXAM: CHEST - 2 VIEW COMPARISON:  06/25/2021 FINDINGS: The lungs are clear without focal pneumonia, edema, pneumothorax or pleural effusion. The cardiopericardial silhouette is within normal limits for size. The visualized bony structures of the thorax are unremarkable. IMPRESSION: No active cardiopulmonary disease. Electronically Signed   By: Kennith Center M.D.   On: 03/22/2022 16:22    Procedures Procedures    Medications Ordered in ED Medications - No data to display  ED Course/ Medical Decision Making/ A&P                           Medical Decision Making Amount and/or Complexity of Data Reviewed External Data Reviewed: notes.    Details: BPs from outpatient visits show typical BP ~130-140 systolic Labs:     Details: Covid test is negative Radiology: ordered and independent interpretation performed.    Details: Chest x-ray without pneumonia   Patient appears to have a viral URI.  No hypoxia or significant shortness of breath.  Doubt bacterial pneumonia.  Doubt bacterial sinusitis at this time.  We discussed over-the-counter remedies and supportive care but otherwise, she appears stable for discharge home.  Blood pressure is improving as her daily BP meds are likely working.  No signs/symptoms of endorgan damage.  Will discharge home with return precautions.        Final Clinical Impression(s) / ED Diagnoses Final diagnoses:  Viral upper respiratory tract infection    Rx / DC Orders ED Discharge Orders     None         Pricilla Loveless, MD 03/22/22 1653

## 2022-03-22 NOTE — ED Triage Notes (Signed)
Patient reports nasal congestion since Tuesday and cough. Denies productive cough.  Denies sore throat.  Patient reports she has been taking OTC meds with no relief.  Reports she took home covid test that were negative.

## 2022-03-27 ENCOUNTER — Emergency Department (HOSPITAL_BASED_OUTPATIENT_CLINIC_OR_DEPARTMENT_OTHER): Payer: Medicare HMO

## 2022-03-27 ENCOUNTER — Emergency Department (HOSPITAL_BASED_OUTPATIENT_CLINIC_OR_DEPARTMENT_OTHER)
Admission: EM | Admit: 2022-03-27 | Discharge: 2022-03-27 | Disposition: A | Discharge status: Home / Self Care | Payer: Medicare HMO | Attending: Emergency Medicine | Admitting: Emergency Medicine

## 2022-03-27 ENCOUNTER — Other Ambulatory Visit: Payer: Self-pay

## 2022-03-27 ENCOUNTER — Encounter (HOSPITAL_BASED_OUTPATIENT_CLINIC_OR_DEPARTMENT_OTHER): Payer: Self-pay | Admitting: Emergency Medicine

## 2022-03-27 DIAGNOSIS — R5383 Other fatigue: Secondary | ICD-10-CM | POA: Diagnosis not present

## 2022-03-27 DIAGNOSIS — Z79899 Other long term (current) drug therapy: Secondary | ICD-10-CM | POA: Insufficient documentation

## 2022-03-27 DIAGNOSIS — Z20822 Contact with and (suspected) exposure to covid-19: Secondary | ICD-10-CM | POA: Diagnosis not present

## 2022-03-27 DIAGNOSIS — R059 Cough, unspecified: Secondary | ICD-10-CM | POA: Insufficient documentation

## 2022-03-27 DIAGNOSIS — I10 Essential (primary) hypertension: Secondary | ICD-10-CM | POA: Insufficient documentation

## 2022-03-27 DIAGNOSIS — J40 Bronchitis, not specified as acute or chronic: Secondary | ICD-10-CM

## 2022-03-27 LAB — COMPREHENSIVE METABOLIC PANEL
ALT: 19 U/L (ref 0–44)
AST: 28 U/L (ref 15–41)
Albumin: 4 g/dL (ref 3.5–5.0)
Alkaline Phosphatase: 89 U/L (ref 38–126)
Anion gap: 6 (ref 5–15)
BUN: 8 mg/dL (ref 8–23)
CO2: 29 mmol/L (ref 22–32)
Calcium: 9.3 mg/dL (ref 8.9–10.3)
Chloride: 102 mmol/L (ref 98–111)
Creatinine, Ser: 0.67 mg/dL (ref 0.44–1.00)
GFR, Estimated: >60 mL/min (ref >60)
Glucose, Bld: 127 mg/dL — ABNORMAL HIGH (ref 70–99)
Potassium: 3.9 mmol/L (ref 3.5–5.1)
Sodium: 137 mmol/L (ref 135–145)
Total Bilirubin: 0.5 mg/dL (ref 0.3–1.2)
Total Protein: 7.6 g/dL (ref 6.5–8.1)

## 2022-03-27 LAB — CBC WITH DIFFERENTIAL/PLATELET
Abs Immature Granulocytes: 0.03 10*3/uL (ref 0.00–0.07)
Basophils Absolute: 0 10*3/uL (ref 0.0–0.1)
Basophils Relative: 0 %
Eosinophils Absolute: 0.2 10*3/uL (ref 0.0–0.5)
Eosinophils Relative: 3 %
HCT: 41.2 % (ref 36.0–46.0)
Hemoglobin: 13.4 g/dL (ref 12.0–15.0)
Immature Granulocytes: 0 %
Lymphocytes Relative: 29 %
Lymphs Abs: 2.5 10*3/uL (ref 0.7–4.0)
MCH: 28.6 pg (ref 26.0–34.0)
MCHC: 32.5 g/dL (ref 30.0–36.0)
MCV: 88 fL (ref 80.0–100.0)
Monocytes Absolute: 0.8 10*3/uL (ref 0.1–1.0)
Monocytes Relative: 10 %
Neutro Abs: 5.1 10*3/uL (ref 1.7–7.7)
Neutrophils Relative %: 58 %
Platelets: 293 10*3/uL (ref 150–400)
RBC: 4.68 MIL/uL (ref 3.87–5.11)
RDW: 13.2 % (ref 11.5–15.5)
WBC: 8.7 10*3/uL (ref 4.0–10.5)
nRBC: 0 % (ref 0.0–0.2)

## 2022-03-27 LAB — SARS CORONAVIRUS 2 BY RT PCR: SARS Coronavirus 2 by RT PCR: NEGATIVE

## 2022-03-27 MED ORDER — SODIUM CHLORIDE 0.9 % IV BOLUS
1000.0000 mL | Freq: Once | INTRAVENOUS | Status: AC
  Administered 2022-03-27: 1000 mL via INTRAVENOUS

## 2022-03-27 MED ORDER — AZITHROMYCIN 250 MG PO TABS: 250.0000 mg | ORAL_TABLET | Freq: Every day | ORAL | 0 refills | Status: AC

## 2022-03-27 MED ORDER — ALBUTEROL SULFATE HFA 108 (90 BASE) MCG/ACT IN AERS
2.0000 | INHALATION_SPRAY | Freq: Once | RESPIRATORY_TRACT | Status: AC
  Administered 2022-03-27: 2 via RESPIRATORY_TRACT
  Filled 2022-03-27: qty 6.7

## 2022-03-27 NOTE — ED Triage Notes (Signed)
Seen here on 10/13, dx with URI. States sx have worsened. C/o cough, yellow phlegm and generalized malaise.

## 2022-03-27 NOTE — Discharge Instructions (Addendum)
Take zpack as prescribed   Use albuterol as needed for cough  See your doctor for follow-up  Return to ER if you have worse cough, fever, trouble breathing

## 2022-03-27 NOTE — ED Provider Notes (Signed)
[place] EMERGENCY DEPARTMENT Provider Note   CSN: [id number] Arrival date & time: 03/27/22  1518     History  Chief Complaint  Patient presents with   Cough   Fatigue    Christine Lynch is a 71 y.o. female history of hypertension, here presenting with cough and fatigue.  Patient states that she has been coughing for the last several days.  She states that she has productive cough with yellow sputum.  Patient also has been very tired.  Patient has tested negative multiple times for COVID.  Patient denies any sick contacts.  The history is provided by the patient.       Home Medications Prior to Admission medications   Medication Sig Start Date End Date Taking? Authorizing Provider  albuterol (PROVENTIL) (2.5 MG/3ML) 0.083% nebulizer solution  01/11/19   [provider]  amLODipine (NORVASC) 5 MG tablet Take 5 mg by mouth daily. 03/09/19   [provider]  cyclobenzaprine (FLEXERIL) 10 MG tablet Take by mouth. 03/23/19   [provider]  diclofenac sodium (VOLTAREN) 1 % GEL  11/18/18   [provider]  dorzolamide (TRUSOPT) 2 % ophthalmic solution Place 1 drop into both eyes 2 (two) times daily.    [provider]  furosemide (LASIX) 40 MG tablet Take 40 mg by mouth.    [provider]  metFORMIN (GLUCOPHAGE) 500 MG tablet  03/27/19   [provider]  mometasone (NASONEX) 50 MCG/ACT nasal spray Place 2 sprays into the nose daily.    [provider]  mupirocin ointment (BACTROBAN) 2 % Place 1 application into the nose 2 (two) times daily.    [provider]  Oxycodone HCl 10 MG TABS  03/16/19   [provider]  pantoprazole (PROTONIX) 40 MG tablet Take 40 mg by mouth daily.    [provider]  potassium chloride (K-DUR) 10 MEQ tablet Take 10 mEq by mouth daily.    [provider]  valsartan (DIOVAN) 80 MG tablet Take 80 mg by mouth daily. 03/11/19   [provider]      Allergies    Baclofen, Fentanyl, Oxymorphone hcl, Amoxicillin-pot clavulanate, Oxymorphone, Sulfamethoxazole-trimethoprim, Promethazine, Ibuprofen, Oxycodone, Sulfa antibiotics, and Sulfamethoxazole    Review of Systems   Review of Systems  Respiratory:  Positive for cough.   All other systems reviewed and are negative.   Physical Exam Updated Vital Signs BP (!) 108/50 (BP Location: Right Arm)   Pulse 73   Temp 99 F (37.2 C) (Oral)   Resp (!) 8   Ht 5\' 6"  (1.676 m)   Wt 98 kg   SpO2 99%   BMI 34.87 kg/m  Physical Exam Vitals and nursing note reviewed.  Constitutional:      Appearance: Normal appearance.  HENT:     Head: Normocephalic.     Nose: Nose normal.     Mouth/Throat:     Mouth: Mucous membranes are moist.  Eyes:     Extraocular Movements: Extraocular movements intact.     Pupils: Pupils are equal, round, and reactive to light.  Cardiovascular:     Rate and Rhythm: Normal rate and regular rhythm.     Pulses: Normal pulses.     Heart sounds: Normal heart sounds.  Pulmonary:     Comments: Diminished bilaterally.  Minimal wheezing but no crackles or retractions Abdominal:     General: Abdomen is flat.     Palpations: Abdomen is soft.  Musculoskeletal:  General: Normal range of motion.     Cervical back: Normal range of motion and neck supple.  Skin:    General: Skin is warm.     Capillary Refill: Capillary refill takes less than 2 seconds.  Neurological:     General: No focal deficit present.     Mental Status: She is alert and oriented to person, place, and time.  Psychiatric:        Mood and Affect: Mood normal.        Behavior: Behavior normal.     ED Results / Procedures / Treatments   Labs (all labs ordered are listed, but only abnormal results are displayed) Labs Reviewed  COMPREHENSIVE METABOLIC PANEL - Abnormal; Notable for the following components:      Result Value   Glucose, Bld 127 (*)    All other  components within normal limits  SARS CORONAVIRUS 2 BY RT PCR  CBC WITH DIFFERENTIAL/PLATELET    EKG None  Radiology DG Chest 2 View  Result Date: 03/27/2022 CLINICAL DATA:  71 year old female with increasing cough. EXAM: CHEST - 2 VIEW COMPARISON:  Chest radiographs 03/22/2022 and earlier. FINDINGS: PA and lateral views at 1546 hours. Stable lung volumes and mediastinal contours, within normal limits. Visualized tracheal air column is within normal limits. Lung markings appear stable with no pneumothorax, pulmonary edema, pleural effusion, or confluent pulmonary opacity. Chronic flowing endplate osteophytes or syndesmophytes in the thoracic spine. No acute osseous abnormality identified. Negative visible bowel gas. IMPRESSION: No acute cardiopulmonary abnormality. Electronically Signed   By: Odessa Fleming M.D.   On: 03/27/2022 15:49    Procedures Procedures    Medications Ordered in ED Medications  sodium chloride 0.9 % bolus 1,000 mL (1,000 mLs Intravenous New Bag/Given 03/27/22 1602)  albuterol (VENTOLIN HFA) 108 (90 Base) MCG/ACT inhaler 2 puff (2 puffs Inhalation Given 03/27/22 1621)    ED Course/ Medical Decision Making/ A&P                           Medical Decision Making Christine Lynch is a 71 y.o. female here presenting with cough and chills.  Patient has low-grade temperature.  Consider bronchitis versus pneumonia.  Plan to get CBC and CMP and chest x-ray.  Will give albuterol and reassess.  5:20 PM Chest x-ray is clear and labs unremarkable.  Felt better after albuterol.  I think likely bronchitis.  Given persistent symptoms we will give azithromycin to cover for atypical organism.  We will encourage her to use albuterol as needed.  Problems Addressed: Bronchitis: acute illness or injury  Amount and/or Complexity of Data Reviewed Labs: ordered. Decision-making details documented in ED Course. Radiology: ordered and independent interpretation performed. Decision-making  details documented in ED Course.  Risk Prescription drug management.    Final Clinical Impression(s) / ED Diagnoses Final diagnoses:  None    Rx / DC Orders ED Discharge Orders     None         Christine Pander, MD 03/27/22 1720

## 2022-06-13 ENCOUNTER — Emergency Department (HOSPITAL_BASED_OUTPATIENT_CLINIC_OR_DEPARTMENT_OTHER): Payer: Medicare HMO

## 2022-06-13 ENCOUNTER — Encounter (HOSPITAL_BASED_OUTPATIENT_CLINIC_OR_DEPARTMENT_OTHER): Payer: Self-pay | Admitting: Urology

## 2022-06-13 ENCOUNTER — Emergency Department (HOSPITAL_BASED_OUTPATIENT_CLINIC_OR_DEPARTMENT_OTHER)
Admission: EM | Admit: 2022-06-13 | Discharge: 2022-06-13 | Disposition: A | Discharge status: Home / Self Care | Payer: Medicare HMO | Attending: Emergency Medicine | Admitting: Emergency Medicine

## 2022-06-13 ENCOUNTER — Other Ambulatory Visit: Payer: Self-pay

## 2022-06-13 DIAGNOSIS — W19XXXA Unspecified fall, initial encounter: Secondary | ICD-10-CM | POA: Insufficient documentation

## 2022-06-13 DIAGNOSIS — M25572 Pain in left ankle and joints of left foot: Secondary | ICD-10-CM | POA: Insufficient documentation

## 2022-06-13 DIAGNOSIS — M25562 Pain in left knee: Secondary | ICD-10-CM | POA: Diagnosis not present

## 2022-06-13 DIAGNOSIS — M25551 Pain in right hip: Secondary | ICD-10-CM | POA: Diagnosis not present

## 2022-06-13 DIAGNOSIS — M2559 Pain in other specified joint: Secondary | ICD-10-CM

## 2022-06-13 DIAGNOSIS — M25552 Pain in left hip: Secondary | ICD-10-CM | POA: Insufficient documentation

## 2022-06-13 NOTE — ED Provider Notes (Signed)
[place] EMERGENCY DEPARTMENT Provider Note   CSN: [id number] Arrival date & time: 06/13/22  1519     History  Chief Complaint  Patient presents with   Christine Lynch is a 72 y.o. female.  Patient here with some acute on chronic pain.  She is having pain to the left knee and left ankle after fall a couple weeks ago.  But she has had prior injuries to these areas and issues in the past.  Having some discomfort in her right hip as well.  History of arthritis.  Uses a walker.  Has been using her narcotic pain medicine with some improvement.  Her latest fall was about 2 weeks ago which has seemed to increase her pain in her left ankle, left knee and right hip.  But she has been able to get around with a walker.  Denies any weakness or numbness.  No headache or neck pain or other extremity pain.  The history is provided by the patient.       Home Medications Prior to Admission medications   Medication Sig Start Date End Date Taking? Authorizing Provider  albuterol (PROVENTIL) (2.5 MG/3ML) 0.083% nebulizer solution  01/11/19   [provider]  amLODipine (NORVASC) 5 MG tablet Take 5 mg by mouth daily. 03/09/19   [provider]  azithromycin (ZITHROMAX) 250 MG tablet Take 1 tablet (250 mg total) by mouth daily. Take first 2 tablets together, then 1 every day until finished. 03/27/22   Drenda Freeze, MD  cyclobenzaprine (FLEXERIL) 10 MG tablet Take by mouth. 03/23/19   [provider]  diclofenac sodium (VOLTAREN) 1 % GEL  11/18/18   [provider]  dorzolamide (TRUSOPT) 2 % ophthalmic solution Place 1 drop into both eyes 2 (two) times daily.    [provider]  furosemide (LASIX) 40 MG tablet Take 40 mg by mouth.    [provider]  metFORMIN (GLUCOPHAGE) 500 MG tablet  03/27/19   [provider]  mometasone (NASONEX) 50 MCG/ACT nasal spray Place 2 sprays into the nose daily.    [provider]   mupirocin ointment (BACTROBAN) 2 % Place 1 application into the nose 2 (two) times daily.    [provider]  Oxycodone HCl 10 MG TABS  03/16/19   [provider]  pantoprazole (PROTONIX) 40 MG tablet Take 40 mg by mouth daily.    [provider]  potassium chloride (K-DUR) 10 MEQ tablet Take 10 mEq by mouth daily.    [provider]  valsartan (DIOVAN) 80 MG tablet Take 80 mg by mouth daily. 03/11/19   [provider]      Allergies    Baclofen, Fentanyl, Oxymorphone hcl, Amoxicillin-pot clavulanate, Oxymorphone, Sulfamethoxazole-trimethoprim, Promethazine, Ibuprofen, Oxycodone, Sulfa antibiotics, and Sulfamethoxazole    Review of Systems   Review of Systems  Physical Exam Updated Vital Signs BP 128/68 (BP Location: Right Arm)   Pulse 90   Temp 98.3 F (36.8 C) (Oral)   Resp 18   Ht 5\' 6"  (1.676 m)   Wt 98 kg   SpO2 98%   BMI 34.87 kg/m  Physical Exam Vitals and nursing note reviewed.  Constitutional:      General: She is not in acute distress.    Appearance: She is well-developed. She is not ill-appearing.  HENT:     Head: Normocephalic and atraumatic.     Nose: Nose normal.     Mouth/Throat:  Mouth: Mucous membranes are moist.  Eyes:     Extraocular Movements: Extraocular movements intact.     Conjunctiva/sclera: Conjunctivae normal.     Pupils: Pupils are equal, round, and reactive to light.  Cardiovascular:     Rate and Rhythm: Normal rate and regular rhythm.     Pulses: Normal pulses.     Heart sounds: No murmur heard. Pulmonary:     Effort: Pulmonary effort is normal. No respiratory distress.     Breath sounds: Normal breath sounds.  Abdominal:     Palpations: Abdomen is soft.     Tenderness: There is no abdominal tenderness.  Musculoskeletal:        General: Tenderness present. No swelling.     Cervical back: Neck supple.     Comments: Tenderness to the left ankle, left knee, bilateral hips  Skin:     General: Skin is warm and dry.     Capillary Refill: Capillary refill takes less than 2 seconds.  Neurological:     General: No focal deficit present.     Mental Status: She is alert and oriented to person, place, and time.     Cranial Nerves: No cranial nerve deficit.     Sensory: No sensory deficit.     Motor: No weakness.     Coordination: Coordination normal.  Psychiatric:        Mood and Affect: Mood normal.     ED Results / Procedures / Treatments   Labs (all labs ordered are listed, but only abnormal results are displayed) Labs Reviewed - No data to display  EKG None  Radiology DG Knee Complete 4 Views Left  Result Date: 06/13/2022 CLINICAL DATA:  Fall 2 weeks ago. EXAM: LEFT KNEE - COMPLETE 4+ VIEW COMPARISON:  None Available. FINDINGS: Severe patellofemoral joint space narrowing, subchondral sclerosis, and moderate superior osteophytosis. Moderate to high-grade chronic enthesopathic change at the patellar tendon insertion on the patella. Mild chronic enthesopathic change at the quadriceps insertion on the patella. No joint effusion. Minimal medial compartment joint space narrowing and peripheral osteophytosis. No acute fracture or dislocation. 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 Electronically Signed   By: Neita Garnet M.D.   On: 06/13/2022 16:16   DG Hip Unilat W or Wo Pelvis 2-3 Views Right  Result Date: 06/13/2022 CLINICAL DATA:  Fall 2 weeks ago.  Right hip pain. EXAM: DG HIP (WITH OR WITHOUT PELVIS) 2-3V RIGHT COMPARISON:  None Available. FINDINGS: Mild bilateral femoroacetabular joint space narrowing and superolateral acetabular degenerative osteophytes. Mild right femoral head-neck junction degenerative osteophytes. Mild pubic symphysis joint space narrowing, superior osteophytosis, and subchondral sclerosis. Mild bilateral sacroiliac inferior subchondral sclerosis. Likely hernia mesh coils overlying the mid to right lower  abdomen. No acute fracture or dislocation. IMPRESSION: Mild bilateral femoroacetabular osteoarthritis. Electronically Signed   By: Neita Garnet M.D.   On: 06/13/2022 16:14   DG Ankle Complete Left  Result Date: 06/13/2022 CLINICAL DATA:  Fall 2 weeks ago. EXAM: LEFT FOOT - COMPLETE 3+ VIEW; LEFT ANKLE COMPLETE - 3+ VIEW COMPARISON:  None Available. FINDINGS: Left ankle: There is high-grade chronic mineralization throughout the distal approximate 9.3 cm of the Achilles tendon. Moderate chronic enthesopathic spur at the Achilles insertion of the posterosuperior aspect of the calcaneus. Tiny plantar calcaneal heel spur. Mild distal medial and lateral malleolar and adjacent talar degenerative osteophytes. 5 mm well corticated chronic ossicle distal to the medial malleolus. Left foot: Mild hallux valgus. Minimal lateral great toe metatarsophalangeal joint space  narrowing. Mild dorsal midfoot degenerative osteophytes. Mild lateral distal calcaneal osteophytosis at the calcaneocuboid articulation. No acute fracture or dislocation. IMPRESSION: 1. Chronic mineralization throughout the Achilles tendon, likely the sequela of remote tears and/or chronic calcific tendinosis. 2. Mild hallux valgus. 3. No acute fracture. Electronically Signed   By: Yvonne Kendall M.D.   On: 06/13/2022 16:12   DG Foot Complete Left  Result Date: 06/13/2022 CLINICAL DATA:  Fall 2 weeks ago. EXAM: LEFT FOOT - COMPLETE 3+ VIEW; LEFT ANKLE COMPLETE - 3+ VIEW COMPARISON:  None Available. FINDINGS: Left ankle: There is high-grade chronic mineralization throughout the distal approximate 9.3 cm of the Achilles tendon. Moderate chronic enthesopathic spur at the Achilles insertion of the posterosuperior aspect of the calcaneus. Tiny plantar calcaneal heel spur. Mild distal medial and lateral malleolar and adjacent talar degenerative osteophytes. 5 mm well corticated chronic ossicle distal to the medial malleolus. Left foot: Mild hallux valgus. Minimal  lateral great toe metatarsophalangeal joint space narrowing. Mild dorsal midfoot degenerative osteophytes. Mild lateral distal calcaneal osteophytosis at the calcaneocuboid articulation. No acute fracture or dislocation. IMPRESSION: 1. Chronic mineralization throughout the Achilles tendon, likely the sequela of remote tears and/or chronic calcific tendinosis. 2. Mild hallux valgus. 3. No acute fracture. Electronically Signed   By: Yvonne Kendall M.D.   On: 06/13/2022 16:12    Procedures Procedures    Medications Ordered in ED Medications - No data to display  ED Course/ Medical Decision Making/ A&P                           Medical Decision Making Amount and/or Complexity of Data Reviewed Radiology: ordered.   Christine Lynch is here with acute on chronic pain.  She is got multiple joint pain chronically she is on oxycodone.  She had a fall 2 weeks ago and seems to have made the left ankle, left knee pain worse than normal.  Continues to have some pain in her hips.  X-rays of these areas including left ankle, left knee and hips were done that showed no acute fracture per my review and interpretation.  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  Arthritis in the hips and knee.  No acute fracture.  She is neurovascular neuromuscular intact otherwise.  She is already using narcotics and muscle relaxants.  She has a walker.  Will put her in a walking boot in the left lower extremity to add some support and have her follow-up with orthopedics with whom she says she does not follow with.  Overall I have no concern for fracture or acute emergent problem at this time.  Patient discharged in good condition.  Understands return precautions.  This chart was dictated using voice recognition software.  Despite best efforts to proofread,  errors can occur which can change the documentation meaning.         Final Clinical Impression(s) / ED Diagnoses Final diagnoses:   Pain in other joint    Rx / DC Orders ED Discharge Orders     None         Lennice Sites, DO 06/13/22 1801

## 2022-06-13 NOTE — ED Triage Notes (Signed)
Pt states fall x 2 weeks ago  Reports left knee pain, contusion to right hip  Also c/o left ankle and left foot pain   Denies LOC with fall

## 2022-06-13 NOTE — ED Notes (Signed)
Pt states she doesn't wish to have a cam boot

## 2023-05-11 ENCOUNTER — Other Ambulatory Visit: Payer: Self-pay

## 2023-05-11 ENCOUNTER — Emergency Department (HOSPITAL_BASED_OUTPATIENT_CLINIC_OR_DEPARTMENT_OTHER)
Admission: EM | Admit: 2023-05-11 | Discharge: 2023-05-11 | Disposition: A | Discharge status: Home / Self Care | Payer: Medicare HMO | Attending: Emergency Medicine | Admitting: Emergency Medicine

## 2023-05-11 ENCOUNTER — Encounter (HOSPITAL_BASED_OUTPATIENT_CLINIC_OR_DEPARTMENT_OTHER): Payer: Self-pay | Admitting: Emergency Medicine

## 2023-05-11 ENCOUNTER — Emergency Department (HOSPITAL_BASED_OUTPATIENT_CLINIC_OR_DEPARTMENT_OTHER): Payer: Medicare HMO

## 2023-05-11 DIAGNOSIS — M25571 Pain in right ankle and joints of right foot: Secondary | ICD-10-CM | POA: Diagnosis present

## 2023-05-11 MED ORDER — DICLOFENAC SODIUM 1 % EX GEL: 2.0000 g | Freq: Two times a day (BID) | CUTANEOUS | 0 refills | Status: AC

## 2023-05-11 MED ORDER — OXYCODONE HCL 5 MG PO TABS
5.0000 mg | ORAL_TABLET | Freq: Once | ORAL | Status: AC
  Administered 2023-05-11: 5 mg via ORAL
  Filled 2023-05-11: qty 1

## 2023-05-11 MED ORDER — OXYCODONE-ACETAMINOPHEN 5-325 MG PO TABS
1.0000 | ORAL_TABLET | Freq: Once | ORAL | Status: AC
  Administered 2023-05-11: 1 via ORAL
  Filled 2023-05-11: qty 1

## 2023-05-11 NOTE — Discharge Instructions (Signed)
It is unclear your why your ankle is hurting.  Apply the anti-inflammatory gel provided.  Use the Ace wrap as needed.  Follow-up with your orthopedist, call them tomorrow for an appointment.  If you develop new or worsening pain, inability to walk, fever or chills, redness to the skin, new or worsening swelling, or any other new/concerning symptoms then return to the ER or call 911.

## 2023-05-11 NOTE — ED Provider Notes (Signed)
[place] EMERGENCY DEPARTMENT AT [facility] Provider Note   CSN: [id number] Arrival date & time: 05/11/23  1253     History  Chief Complaint  Patient presents with   Foot Pain    Christine Lynch is a 72 y.o. female.  HPI 72 year old female presents with right ankle pain.  When she woke up yesterday and started moving around her foot she noticed pain in the ankle.  It hurts with any movement.  No trauma.  She has never had gout before.  No systemic symptoms such as fevers or chills.  No trauma.  She took her oxycodone last night but it did not seem to help.  She is able to walk but it is painful with her cane.  She feels like her lateral ankle is swollen.  Home Medications Prior to Admission medications   Medication Sig Start Date End Date Taking? Authorizing Provider  diclofenac Sodium (VOLTAREN) 1 % GEL Apply 2 g topically in the morning and at bedtime. 05/11/23  Yes Pricilla Loveless, MD  albuterol (PROVENTIL) (2.5 MG/3ML) 0.083% nebulizer solution  01/11/19   [provider]  amLODipine (NORVASC) 5 MG tablet Take 5 mg by mouth daily. 03/09/19   [provider]  azithromycin (ZITHROMAX) 250 MG tablet Take 1 tablet (250 mg total) by mouth daily. Take first 2 tablets together, then 1 every day until finished. 03/27/22   Charlynne Pander, MD  cyclobenzaprine (FLEXERIL) 10 MG tablet Take by mouth. 03/23/19   [provider]  dorzolamide (TRUSOPT) 2 % ophthalmic solution Place 1 drop into both eyes 2 (two) times daily.    [provider]  furosemide (LASIX) 40 MG tablet Take 40 mg by mouth.    [provider]  metFORMIN (GLUCOPHAGE) 500 MG tablet  03/27/19   [provider]  mometasone (NASONEX) 50 MCG/ACT nasal spray Place 2 sprays into the nose daily.    [provider]  mupirocin ointment (BACTROBAN) 2 % Place 1 application into the nose 2 (two) times daily.    [provider]  Oxycodone HCl 10 MG TABS   03/16/19   [provider]  pantoprazole (PROTONIX) 40 MG tablet Take 40 mg by mouth daily.    [provider]  potassium chloride (K-DUR) 10 MEQ tablet Take 10 mEq by mouth daily.    [provider]  valsartan (DIOVAN) 80 MG tablet Take 80 mg by mouth daily. 03/11/19   [provider]      Allergies    Baclofen, Fentanyl, Oxymorphone hcl, Amoxicillin-pot clavulanate, Oxymorphone, Sulfamethoxazole-trimethoprim, Promethazine, Ibuprofen, Oxycodone, Sulfa antibiotics, and Sulfamethoxazole    Review of Systems   Review of Systems  Constitutional:  Negative for chills and fever.  Musculoskeletal:  Positive for arthralgias and joint swelling.  Skin:  Negative for color change.    Physical Exam Updated Vital Signs BP (!) 151/72   Pulse 78   Temp 98.4 F (36.9 C)   Resp 18   Ht 5\' 5"  (1.651 m)   Wt 106.1 kg   SpO2 96%   BMI 38.94 kg/m  Physical Exam Vitals and nursing note reviewed.  Constitutional:      Appearance: She is well-developed.  HENT:     Head: Normocephalic and atraumatic.  Cardiovascular:     Rate and Rhythm: Normal rate and regular rhythm.     Pulses:          Dorsalis pedis pulses are 2+ on the right side.  Pulmonary:  Effort: Pulmonary effort is normal.  Abdominal:     General: There is no distension.  Musculoskeletal:     Right ankle: Tenderness present. Decreased range of motion (is painful).     Right Achilles Tendon: No defects.     Right foot: No swelling or tenderness.     Comments: There appears to be some mild right lateral ankle swelling. Might be slighter warmer than left but is subtle. No erythema.  I am able to passively range her right ankle but it is painful to her.  Skin:    General: Skin is warm and dry.  Neurological:     Mental Status: She is alert.     ED Results / Procedures / Treatments   Labs (all labs ordered are listed, but only abnormal results are displayed) Labs Reviewed - No data to  display  EKG None  Radiology DG Ankle Complete Right  Result Date: 05/11/2023 CLINICAL DATA:  One day history of right foot and ankle pain. No known injury. EXAM: RIGHT ANKLE - COMPLETE 3 VIEW COMPARISON:  None Available. FINDINGS: There are no findings of fracture or dislocation. No joint effusion. Degenerative changes of the ankle and hindfoot. Plantar and dorsal calcaneal enthesopathy. Ankle mortise is intact. Soft tissues are unremarkable. IMPRESSION: 1. No acute fracture or dislocation. 2. Degenerative changes of the ankle and hindfoot. Electronically Signed   By: Agustin Cree M.D.   On: 05/11/2023 13:50    Procedures Procedures    Medications Ordered in ED Medications  oxyCODONE (Oxy IR/ROXICODONE) immediate release tablet 5 mg (5 mg Oral Given 05/11/23 1327)  oxyCODONE-acetaminophen (PERCOCET/ROXICET) 5-325 MG per tablet 1 tablet (1 tablet Oral Given 05/11/23 1327)    ED Course/ Medical Decision Making/ A&P                                 Medical Decision Making Amount and/or Complexity of Data Reviewed Radiology: ordered and independent interpretation performed.    Details: No fracture  Risk Prescription drug management.   Patient with about 24+ hours of ankle pain.  No obvious joint effusion on exam or on x-ray.  No clear cause.  Doubt septic arthritis.  Exam not consistent with cellulitis.  She is unable to tolerate NSAIDs.  Will place an Ace wrap and recommend follow-up with her orthopedist.  She is otherwise well-appearing with normal vitals.  Given her history of diabetes and unclear cause, will hold off on steroids. Will recommend diclofenac gel. Will discharge home with return precautions.        Final Clinical Impression(s) / ED Diagnoses Final diagnoses:  Acute right ankle pain    Rx / DC Orders ED Discharge Orders          Ordered    diclofenac Sodium (VOLTAREN) 1 % GEL  2 times daily        05/11/23 1407              Pricilla Loveless,  MD 05/11/23 1408

## 2023-05-11 NOTE — ED Triage Notes (Signed)
Pt c/o RT foot and ankle pain since yesterday; no injury

## 2023-10-01 ENCOUNTER — Encounter (HOSPITAL_BASED_OUTPATIENT_CLINIC_OR_DEPARTMENT_OTHER): Payer: Self-pay | Admitting: Emergency Medicine

## 2023-10-01 ENCOUNTER — Other Ambulatory Visit: Payer: Self-pay

## 2023-10-01 ENCOUNTER — Emergency Department (HOSPITAL_BASED_OUTPATIENT_CLINIC_OR_DEPARTMENT_OTHER)
Admission: EM | Admit: 2023-10-01 | Discharge: 2023-10-01 | Disposition: A | Discharge status: Home / Self Care | Attending: Emergency Medicine | Admitting: Emergency Medicine

## 2023-10-01 DIAGNOSIS — R112 Nausea with vomiting, unspecified: Secondary | ICD-10-CM

## 2023-10-01 DIAGNOSIS — E860 Dehydration: Secondary | ICD-10-CM | POA: Insufficient documentation

## 2023-10-01 DIAGNOSIS — Z7984 Long term (current) use of oral hypoglycemic drugs: Secondary | ICD-10-CM | POA: Diagnosis not present

## 2023-10-01 DIAGNOSIS — A084 Viral intestinal infection, unspecified: Secondary | ICD-10-CM | POA: Insufficient documentation

## 2023-10-01 DIAGNOSIS — E876 Hypokalemia: Secondary | ICD-10-CM | POA: Diagnosis not present

## 2023-10-01 LAB — URINALYSIS, ROUTINE W REFLEX MICROSCOPIC
Bilirubin Urine: NEGATIVE
Glucose, UA: NEGATIVE mg/dL
Ketones, ur: NEGATIVE mg/dL
Nitrite: NEGATIVE
Protein, ur: 30 mg/dL — AB
Specific Gravity, Urine: 1.025 (ref 1.005–1.030)
pH: 6.5 (ref 5.0–8.0)

## 2023-10-01 LAB — COMPREHENSIVE METABOLIC PANEL WITH GFR
ALT: 22 U/L (ref 0–44)
AST: 33 U/L (ref 15–41)
Albumin: 4.2 g/dL (ref 3.5–5.0)
Alkaline Phosphatase: 81 U/L (ref 38–126)
Anion gap: 15 (ref 5–15)
BUN: 7 mg/dL — ABNORMAL LOW (ref 8–23)
CO2: 23 mmol/L (ref 22–32)
Calcium: 9 mg/dL (ref 8.9–10.3)
Chloride: 103 mmol/L (ref 98–111)
Creatinine, Ser: 0.8 mg/dL (ref 0.44–1.00)
GFR, Estimated: >60 mL/min (ref >60)
Glucose, Bld: 125 mg/dL — ABNORMAL HIGH (ref 70–99)
Potassium: 3.2 mmol/L — ABNORMAL LOW (ref 3.5–5.1)
Sodium: 141 mmol/L (ref 135–145)
Total Bilirubin: 0.4 mg/dL (ref 0.0–1.2)
Total Protein: 7.2 g/dL (ref 6.5–8.1)

## 2023-10-01 LAB — CBC
HCT: 41.2 % (ref 36.0–46.0)
Hemoglobin: 13.7 g/dL (ref 12.0–15.0)
MCH: 29.5 pg (ref 26.0–34.0)
MCHC: 33.3 g/dL (ref 30.0–36.0)
MCV: 88.8 fL (ref 80.0–100.0)
Platelets: 208 10*3/uL (ref 150–400)
RBC: 4.64 MIL/uL (ref 3.87–5.11)
RDW: 13.2 % (ref 11.5–15.5)
WBC: 5.3 10*3/uL (ref 4.0–10.5)
nRBC: 0 % (ref 0.0–0.2)

## 2023-10-01 LAB — LIPASE, BLOOD: Lipase: 12 U/L (ref 11–51)

## 2023-10-01 LAB — URINALYSIS, MICROSCOPIC (REFLEX)

## 2023-10-01 MED ORDER — POTASSIUM CHLORIDE CRYS ER 20 MEQ PO TBCR
20.0000 meq | EXTENDED_RELEASE_TABLET | Freq: Once | ORAL | Status: AC
  Administered 2023-10-01: 20 meq via ORAL
  Filled 2023-10-01: qty 1

## 2023-10-01 MED ORDER — SODIUM CHLORIDE 0.9 % IV BOLUS
1000.0000 mL | Freq: Once | INTRAVENOUS | Status: AC
  Administered 2023-10-01: 1000 mL via INTRAVENOUS

## 2023-10-01 MED ORDER — POTASSIUM CHLORIDE 10 MEQ/100ML IV SOLN
10.0000 meq | Freq: Once | INTRAVENOUS | Status: AC
  Administered 2023-10-01: 10 meq via INTRAVENOUS
  Filled 2023-10-01: qty 100

## 2023-10-01 MED ORDER — ONDANSETRON HCL 4 MG PO TABS: 4.0000 mg | ORAL_TABLET | Freq: Four times a day (QID) | ORAL | 0 refills | Status: AC | PRN

## 2023-10-01 MED ORDER — LOPERAMIDE HCL 2 MG PO CAPS
4.0000 mg | ORAL_CAPSULE | Freq: Once | ORAL | Status: AC
  Administered 2023-10-01: 4 mg via ORAL
  Filled 2023-10-01: qty 2

## 2023-10-01 MED ORDER — SODIUM CHLORIDE 0.9 % IV SOLN
8.0000 mg | Freq: Once | INTRAVENOUS | Status: AC
  Administered 2023-10-01: 8 mg via INTRAVENOUS
  Filled 2023-10-01: qty 4

## 2023-10-01 MED ORDER — ONDANSETRON HCL 4 MG/2ML IJ SOLN
INTRAMUSCULAR | Status: AC
  Filled 2023-10-01: qty 4

## 2023-10-01 MED ORDER — PROMETHAZINE HCL 25 MG PO TABS: 25.0000 mg | ORAL_TABLET | Freq: Three times a day (TID) | ORAL | 0 refills | Status: AC | PRN

## 2023-10-01 NOTE — ED Provider Notes (Signed)
 3:40 PM Care assumed from Dr. Martina Sledge.  At time of transfer of care, patient is awaiting reassessment after potassium ministration and fluids to help rehydrate and help with symptoms of all this nausea, vomiting, diarrhea.  Other workup here in the emergency department did not show any concerning findings that require admission at this time.   Plan of care is to discharge home if patient feeling better with prescription for nausea medicine.   Anticipate discharge after reassessment.  5:29 PM Patient's fluids and potassium that finished.  She was reassessed and she is starting feel better.  We will do a p.o. challenge if she passes, will get her ready for discharge home.  Patient agrees to this plan.  She said that the Zofran  she was given did not work very well last time and was requesting a prescription for some Phenergan .  Will print this prescription for her to use if she needs it.   Clinical Impression: 1. Viral gastroenteritis   2. Nausea vomiting and diarrhea   3. Hypokalemia     Disposition: Discharge  Condition: Good  I have discussed the results, Dx and Tx plan with the pt(& family if present). He/she/they expressed understanding and agree(s) with the plan. Discharge instructions discussed at great length. Strict return precautions discussed and pt &/or family have verbalized understanding of the instructions. No further questions at time of discharge.    New Prescriptions   ONDANSETRON  (ZOFRAN ) 4 MG TABLET    Take 1 tablet (4 mg total) by mouth every 6 (six) hours as needed for nausea or vomiting.   PROMETHAZINE  (PHENERGAN ) 25 MG TABLET    Take 1 tablet (25 mg total) by mouth every 8 (eight) hours as needed for nausea or vomiting.    Follow Up: your PCP     [facility] Emergency Department at [facility] [address] [place]  [address] [phone]       Dora Simeone, Marine Sia, MD 10/01/23 1739

## 2023-10-01 NOTE — ED Triage Notes (Signed)
 Emesis all day last Sunday ,  continuous diarrhea 3 days ago . Abd pain . Denies fever . Negative Covid home test .

## 2023-10-01 NOTE — ED Provider Notes (Signed)
 [place] [facility] Provider Note   CSN: [id number] Arrival date & time: 10/01/23  1119     History  Chief Complaint  Patient presents with   Diarrhea   Emesis    Christine Lynch is a 73 y.o. female.  Patient is a 73 year old female presenting for nausea, vomiting, diarrhea x 3 days.  Patient admits to generalized abdominal discomfort but no specific pain.  Has not taken any medications prior to arrival.  Denies fevers or chills.  Feels dehydrated.  States she had over 10 episodes of nonbloody emesis the first day.  The history is provided by the patient. No language interpreter was used.  Diarrhea Associated symptoms: vomiting   Associated symptoms: no abdominal pain, no arthralgias, no chills and no fever   Emesis Associated symptoms: diarrhea   Associated symptoms: no abdominal pain, no arthralgias, no chills, no cough, no fever and no sore throat        Home Medications Prior to Admission medications   Medication Sig Start Date End Date Taking? Authorizing Provider  albuterol  (PROVENTIL ) (2.5 MG/3ML) 0.083% nebulizer solution  01/11/19   [provider]  amLODipine (NORVASC) 5 MG tablet Take 5 mg by mouth daily. 03/09/19   [provider]  azithromycin  (ZITHROMAX ) 250 MG tablet Take 1 tablet (250 mg total) by mouth daily. Take first 2 tablets together, then 1 every day until finished. 03/27/22   Dalene Duck, MD  cyclobenzaprine (FLEXERIL) 10 MG tablet Take by mouth. 03/23/19   [provider]  diclofenac  Sodium (VOLTAREN ) 1 % GEL Apply 2 g topically in the morning and at bedtime. 05/11/23   Jerilynn Montenegro, MD  dorzolamide (TRUSOPT) 2 % ophthalmic solution Place 1 drop into both eyes 2 (two) times daily.    [provider]  furosemide (LASIX) 40 MG tablet Take 40 mg by mouth.    [provider]  metFORMIN (GLUCOPHAGE) 500 MG tablet  03/27/19   [provider]  mometasone  (NASONEX) 50 MCG/ACT nasal spray Place 2 sprays into the nose daily.    [provider]  mupirocin ointment (BACTROBAN) 2 % Place 1 application into the nose 2 (two) times daily.    [provider]  Oxycodone  HCl 10 MG TABS  03/16/19   [provider]  pantoprazole (PROTONIX) 40 MG tablet Take 40 mg by mouth daily.    [provider]  potassium chloride  (K-DUR) 10 MEQ tablet Take 10 mEq by mouth daily.    [provider]  valsartan (DIOVAN) 80 MG tablet Take 80 mg by mouth daily. 03/11/19   [provider]      Allergies    Baclofen, Fentanyl, Oxymorphone hcl, Amoxicillin-pot clavulanate, Oxymorphone, Sulfamethoxazole -trimethoprim , Promethazine , Ibuprofen, Oxycodone , Sulfa  antibiotics, and Sulfamethoxazole     Review of Systems   Review of Systems  Constitutional:  Negative for chills and fever.  HENT:  Negative for ear pain and sore throat.   Eyes:  Negative for pain and visual disturbance.  Respiratory:  Negative for cough and shortness of breath.   Cardiovascular:  Negative for chest pain and palpitations.  Gastrointestinal:  Positive for diarrhea and vomiting. Negative for abdominal pain.  Genitourinary:  Negative for dysuria and hematuria.  Musculoskeletal:  Negative for arthralgias and back pain.  Skin:  Negative for color change and rash.  Neurological:  Negative for seizures and syncope.  All other systems reviewed and are negative.   Physical Exam Updated Vital Signs BP Aaron Aas)  136/59   Pulse 75   Temp 98.9 F (37.2 C) (Oral)   Resp 16   Wt 104.3 kg   SpO2 100%   BMI 38.27 kg/m  Physical Exam Vitals and nursing note reviewed.  Constitutional:      General: She is not in acute distress.    Appearance: She is well-developed.  HENT:     Head: Normocephalic and atraumatic.  Eyes:     Conjunctiva/sclera: Conjunctivae normal.  Cardiovascular:     Rate and Rhythm: Normal rate and regular rhythm.     Heart sounds: No  murmur heard. Pulmonary:     Effort: Pulmonary effort is normal. No respiratory distress.     Breath sounds: Normal breath sounds.  Abdominal:     Palpations: Abdomen is soft.     Tenderness: There is abdominal tenderness in the epigastric area. There is no guarding or rebound.  Musculoskeletal:        General: No swelling.     Cervical back: Neck supple.  Skin:    General: Skin is warm and dry.     Capillary Refill: Capillary refill takes less than 2 seconds.  Neurological:     Mental Status: She is alert.  Psychiatric:        Mood and Affect: Mood normal.     ED Results / Procedures / Treatments   Labs (all labs ordered are listed, but only abnormal results are displayed) Labs Reviewed  COMPREHENSIVE METABOLIC PANEL WITH GFR - Abnormal; Notable for the following components:      Result Value   Potassium 3.2 (*)    Glucose, Bld 125 (*)    BUN 7 (*)    All other components within normal limits  URINALYSIS, ROUTINE W REFLEX MICROSCOPIC - Abnormal; Notable for the following components:   Hgb urine dipstick TRACE (*)    Protein, ur 30 (*)    Leukocytes,Ua SMALL (*)    All other components within normal limits  URINALYSIS, MICROSCOPIC (REFLEX) - Abnormal; Notable for the following components:   Bacteria, UA FEW (*)    All other components within normal limits  LIPASE, BLOOD  CBC    EKG None  Radiology No results found.  Procedures Procedures    Medications Ordered in ED Medications  ondansetron  (ZOFRAN ) 4 MG/2ML injection (  Not Given 10/01/23 1500)  potassium chloride  10 mEq in 100 mL IVPB (has no administration in time range)  potassium chloride  SA (KLOR-CON  M) CR tablet 20 mEq (has no administration in time range)  sodium chloride  0.9 % bolus 1,000 mL (has no administration in time range)  ondansetron  (ZOFRAN ) 8 mg in sodium chloride  0.9 % 50 mL IVPB (8 mg Intravenous New Bag/Given 10/01/23 1500)  loperamide  (IMODIUM ) capsule 4 mg (4 mg Oral Given 10/01/23 1457)     ED Course/ Medical Decision Making/ A&P                                 Medical Decision Making Amount and/or Complexity of Data Reviewed Labs: ordered.  Risk Prescription drug management.   105:70 PM  73 year old female presenting for nausea, vomiting, diarrhea x 3 days.  Patient is alert and oriented x 3, no acute distress, afebrile, so vital signs.  Physical exam demonstrates soft abdomen with mild tenderness to palpation epigastric region only.  No guarding or rigidity.  No distention.  Laboratory studies are stable with stable liver profile, lipase, and  renal function.  Concerns for dehydration due to vomiting and likely viral gastroenteritis.  IV fluids, Zofran , loperamide  given.  No recent antibiotics or foreign travel.  Low suspicion for C. difficile colitis or any parasites.  Norovirus is going around in the [place] region currently.  Will reevaluate after treatment.  Hypokalemia on laboratory studies.  Potassium replaced.  Signed out to oncoming provider while awaiting treatment and reevaluation.        Final Clinical Impression(s) / ED Diagnoses Final diagnoses:  None    Rx / DC Orders ED Discharge Orders     None         Quinn Bucco, DO 10/01/23 1526

## 2023-10-01 NOTE — Discharge Instructions (Addendum)
 Rest, eat a bland diet when you can tolerate food, stay hydrated, increase electrolytes with Pedialyte.  Antinausea medication called Zofran  sent to your pharmacy.  Norovirus lasts approximately 5 to 10 days.  Please follow-up with your PCP if nausea, vomiting, diarrhea does not begin to slow down.  I also printed a prescription for Phenergan  for you in case the Zofran  did not work before.  Please rest and stay hydrated and follow-up with your PCP.

## 2023-10-01 NOTE — ED Notes (Signed)
 Pt provided with graham crackers and sprite for PO challenge, per Dr. Manus Sellers.   Anastacio Balm, RN

## 2024-01-18 ENCOUNTER — Emergency Department (HOSPITAL_BASED_OUTPATIENT_CLINIC_OR_DEPARTMENT_OTHER)

## 2024-01-18 ENCOUNTER — Other Ambulatory Visit: Payer: Self-pay

## 2024-01-18 ENCOUNTER — Emergency Department (HOSPITAL_BASED_OUTPATIENT_CLINIC_OR_DEPARTMENT_OTHER)
Admission: EM | Admit: 2024-01-18 | Discharge: 2024-01-18 | Disposition: A | Discharge status: Home / Self Care | Attending: Emergency Medicine | Admitting: Emergency Medicine

## 2024-01-18 ENCOUNTER — Encounter (HOSPITAL_BASED_OUTPATIENT_CLINIC_OR_DEPARTMENT_OTHER): Payer: Self-pay | Admitting: Emergency Medicine

## 2024-01-18 DIAGNOSIS — M542 Cervicalgia: Secondary | ICD-10-CM | POA: Insufficient documentation

## 2024-01-18 DIAGNOSIS — M25511 Pain in right shoulder: Secondary | ICD-10-CM | POA: Diagnosis present

## 2024-01-18 MED ORDER — HYDROMORPHONE HCL 1 MG/ML IJ SOLN
1.0000 mg | Freq: Once | INTRAMUSCULAR | Status: AC
  Administered 2024-01-18: 1 mg via INTRAMUSCULAR
  Filled 2024-01-18: qty 1

## 2024-01-18 MED ORDER — PREDNISONE 10 MG PO TABS
60.0000 mg | ORAL_TABLET | Freq: Once | ORAL | Status: AC
  Administered 2024-01-18: 60 mg via ORAL
  Filled 2024-01-18: qty 1

## 2024-01-18 MED ORDER — KETOROLAC TROMETHAMINE 30 MG/ML IJ SOLN
30.0000 mg | Freq: Once | INTRAMUSCULAR | Status: AC
  Administered 2024-01-18: 30 mg via INTRAMUSCULAR
  Filled 2024-01-18: qty 1

## 2024-01-18 NOTE — ED Provider Notes (Signed)
 [place] [facility] Provider Note   CSN: [id number] Arrival date & time: 01/18/24  1244    Patient presents with: Shoulder Pain   Christine Lynch is a 73 y.o. female here for evaluation of right shoulder pain.  Patient states she has chronic right shoulder and neck pain.  Is limited range of motion of her neck due to prior cervical fusion repair.  Has limited range of motion at baseline however when had a mammogram on Friday and had to lift her arms for a long period of time since then has had severe pain to her right shoulder with difficulty with range of motion.  No redness, warmth, new numbness or weakness.  She typically takes Oxy 10's for chronic lower back pain.  Pain unrelieved at home.  She is supposed to start on a steroid pack for her pain however has not started taking this yet.  Blood sugars are typically normally controlled.  No chest pain, shortness of breath, no recent falls or injuries.  Had tried putting ice pack on shoulder without relief. Cannot take oral NSAIDs at home due to side effects.   HPI     Prior to Admission medications   Medication Sig Start Date End Date Taking? Authorizing Provider  albuterol  (PROVENTIL ) (2.5 MG/3ML) 0.083% nebulizer solution  01/11/19   [provider]  amLODipine (NORVASC) 5 MG tablet Take 5 mg by mouth daily. 03/09/19   [provider]  azithromycin  (ZITHROMAX ) 250 MG tablet Take 1 tablet (250 mg total) by mouth daily. Take first 2 tablets together, then 1 every day until finished. 03/27/22   Patt Alm Macho, MD  cyclobenzaprine (FLEXERIL) 10 MG tablet Take by mouth. 03/23/19   [provider]  diclofenac  Sodium (VOLTAREN ) 1 % GEL Apply 2 g topically in the morning and at bedtime. 05/11/23   Freddi Hamilton, MD  dorzolamide (TRUSOPT) 2 % ophthalmic solution Place 1 drop into both eyes 2 (two) times daily.    [provider]  furosemide (LASIX) 40 MG tablet Take 40 mg by  mouth.    [provider]  metFORMIN (GLUCOPHAGE) 500 MG tablet  03/27/19   [provider]  mometasone (NASONEX) 50 MCG/ACT nasal spray Place 2 sprays into the nose daily.    [provider]  mupirocin ointment (BACTROBAN) 2 % Place 1 application into the nose 2 (two) times daily.    [provider]  ondansetron  (ZOFRAN ) 4 MG tablet Take 1 tablet (4 mg total) by mouth every 6 (six) hours as needed for nausea or vomiting. 10/01/23   Elnor Hila P, DO  Oxycodone  HCl 10 MG TABS  03/16/19   [provider]  pantoprazole (PROTONIX) 40 MG tablet Take 40 mg by mouth daily.    [provider]  potassium chloride  (K-DUR) 10 MEQ tablet Take 10 mEq by mouth daily.    [provider]  promethazine  (PHENERGAN ) 25 MG tablet Take 1 tablet (25 mg total) by mouth every 8 (eight) hours as needed for nausea or vomiting. 10/01/23   Tegeler, Lonni PARAS, MD  valsartan (DIOVAN) 80 MG tablet Take 80 mg by mouth daily. 03/11/19   [provider]    Allergies: Baclofen, Fentanyl, Oxymorphone hcl, Amoxicillin-pot clavulanate, Oxymorphone, Sulfamethoxazole -trimethoprim , Promethazine , Ibuprofen, Sulfa  antibiotics, and Sulfamethoxazole     Review of Systems  Constitutional: Negative.   HENT: Negative.    Respiratory: Negative.    Cardiovascular: Negative.   Gastrointestinal: Negative.   Genitourinary: Negative.  Musculoskeletal:        Right shoulder pain  Skin: Negative.   Neurological: Negative.   All other systems reviewed and are negative.   Updated Vital Signs BP (!) 148/66   Pulse 85   Temp 98.4 F (36.9 C)   Resp 16   Ht 5' 4 (1.626 m)   Wt 100.2 kg   SpO2 96%   BMI 37.93 kg/m   Physical Exam Vitals and nursing note reviewed.  Constitutional:      General: She is not in acute distress.    Appearance: She is well-developed. She is not ill-appearing.  HENT:     Head: Atraumatic.  Eyes:     Pupils: Pupils are equal,  round, and reactive to light.  Neck:     Comments: Decreased ROM, chronic Cardiovascular:     Rate and Rhythm: Normal rate.     Pulses: Normal pulses.          Radial pulses are 2+ on the right side and 2+ on the left side.     Heart sounds: Normal heart sounds.  Pulmonary:     Effort: No respiratory distress.  Abdominal:     General: There is no distension.  Musculoskeletal:        General: Normal range of motion.     Cervical back: Normal range of motion.     Comments: Diffuse tenderness throughout right shoulder.  Nontender clavicle, scapula.  Radiates down the arm.  Difficulty with active and passive range of motion above 30 degrees.  No bony tenderness forearm, humerus  Skin:    General: Skin is warm and dry.     Capillary Refill: Capillary refill takes less than 2 seconds.     Comments: No edema, erythema or warmth  Neurological:     General: No focal deficit present.     Mental Status: She is alert.     Comments: Wiggles fingers Decree strength right upper extremity due to chronic cervical radiculopathy and pain  Psychiatric:        Mood and Affect: Mood normal.     (all labs ordered are listed, but only abnormal results are displayed) Labs Reviewed - No data to display  EKG: None  Radiology: DG Shoulder Right Result Date: 01/18/2024 CLINICAL DATA:  Pain. EXAM: RIGHT SHOULDER - 2+ VIEW COMPARISON:  None Available. FINDINGS: Superior subluxation of the humeral head abutting the undersurface of the acromion. Moderate glenohumeral degenerative change with joint space narrowing and spurring. Moderate acromioclavicular degenerative change with spurring. Subcortical cystic change of the lateral humeral head. No fracture, erosive change or suspicious bone lesion. IMPRESSION: 1. Moderate glenohumeral and acromioclavicular degenerative change. 2. Superior subluxation of the humeral head abutting the undersurface of the acromion, typical of chronic rotator cuff arthropathy.  Electronically Signed   By: Andrea Gasman M.D.   On: 01/18/2024 13:53     Procedures   Medications Ordered in the ED  HYDROmorphone  (DILAUDID ) injection 1 mg (1 mg Intramuscular Given 01/18/24 1447)  predniSONE  (DELTASONE ) tablet 60 mg (60 mg Oral Given 01/18/24 1447)  ketorolac  (TORADOL ) 30 MG/ML injection 30 mg (30 mg Intramuscular Given 01/18/24 767)   73 year old history of chronic pain here for evaluation of acute on chronic right shoulder pain.  She has known issues to her shoulder and her neck at baseline.  Was lifting her arms for mammogram on Friday and since then has had severe pain in her right shoulder.  She has decreased range of motion to the  shoulder.  No new neck pain.  No edema, erythema or warmth.  Decreased strength right arm, chronic per patient.  No obvious joint effusion to shoulder.  I suspect likely acute on chronic pain.  No symptoms to suggest septic joint, gout, hemarthrosis, fracture or dislocation.  No clinical evidence of VTE on exam.  She has equal pulses bilaterally low suspicion for ischemia.  She is some chronic cervical radiculopathy however denies any new neck pain or rigidity.  She is post to start on steroid pack for other pain that she was having last week as well as not started this.  She takes oxycodone  10 mg as well as Flexeril at home.   Imaging personally viewed and interpreted:  Chronic rotator cuff tear, severe arthritis  Discussed results with patient.  Will treat symptomatically here.  Discussed starting the steroid pack that she was previously prescribed, can add in Lidoderm patches.  Do not want to prescribe more opiates she takes Oxy 10's every 4 hours at home.  Will have her follow-up with orthopedics, return for new or worsening symptoms.  The patient has been appropriately medically screened and/or stabilized in the ED. I have low suspicion for any other emergent medical condition which would require further screening, evaluation or treatment  in the ED or require inpatient management.  Patient is hemodynamically stable and in no acute distress.  Patient able to ambulate in department prior to ED.  Evaluation does not show acute pathology that would require ongoing or additional emergent interventions while in the emergency department or further inpatient treatment.  I have discussed the diagnosis with the patient and answered all questions.  Pain is been managed while in the emergency department and patient has no further complaints prior to discharge.  Patient is comfortable with plan discussed in room and is stable for discharge at this time.  I have discussed strict return precautions for returning to the emergency department.  Patient was encouraged to follow-up with PCP/specialist refer to at discharge.                                    Medical Decision Making Amount and/or Complexity of Data Reviewed External Data Reviewed: labs, radiology and notes. Radiology: ordered and independent interpretation performed. Decision-making details documented in ED Course.  Risk OTC drugs. Prescription drug management. Parenteral controlled substances. Decision regarding hospitalization. Diagnosis or treatment significantly limited by social determinants of health.       Final diagnoses:  Acute pain of right shoulder    ED Discharge Orders     None          Novi Calia A, PA-C 01/18/24 1711    Lenor Hollering, MD 01/23/24 0700

## 2024-01-18 NOTE — ED Notes (Signed)
 Pt complained of R shoulder pain with ROM. No acute distress, lying in bed without issue.

## 2024-01-18 NOTE — ED Triage Notes (Signed)
 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

## 2024-01-18 NOTE — Discharge Instructions (Addendum)
 It was a pleasure taking care of you here today  Continue to take your home oxycodone  and muscle relaxer.  Unfortunately pharmacy will not fill any additional narcotic pain medicine.  I would recommend following up with the provider that prescribes your chronic pain medicine to see if they can increase this while you are having acute pain.  You may also see orthopedics to see if you can get an injection tomorrow  Start taking the prednisone  that you have at home  Make sure you follow-up outpatient, return for any worsening symptoms

## 2024-03-24 DIAGNOSIS — Z0000 Encounter for general adult medical examination without abnormal findings: Secondary | ICD-10-CM | POA: Diagnosis not present

## 2024-03-24 DIAGNOSIS — I10 Essential (primary) hypertension: Secondary | ICD-10-CM | POA: Diagnosis not present

## 2024-03-24 DIAGNOSIS — Z13220 Encounter for screening for lipoid disorders: Secondary | ICD-10-CM | POA: Diagnosis not present

## 2024-03-24 DIAGNOSIS — Z1329 Encounter for screening for other suspected endocrine disorder: Secondary | ICD-10-CM | POA: Diagnosis not present

## 2024-03-24 DIAGNOSIS — Z131 Encounter for screening for diabetes mellitus: Secondary | ICD-10-CM | POA: Diagnosis not present

## 2024-03-24 DIAGNOSIS — Z23 Encounter for immunization: Secondary | ICD-10-CM | POA: Diagnosis not present

## 2024-03-31 DIAGNOSIS — E7800 Pure hypercholesterolemia, unspecified: Secondary | ICD-10-CM | POA: Diagnosis not present

## 2024-03-31 DIAGNOSIS — E119 Type 2 diabetes mellitus without complications: Secondary | ICD-10-CM | POA: Diagnosis not present

## 2024-03-31 DIAGNOSIS — Z6838 Body mass index (BMI) 38.0-38.9, adult: Secondary | ICD-10-CM | POA: Diagnosis not present

## 2024-04-06 DIAGNOSIS — M25552 Pain in left hip: Secondary | ICD-10-CM | POA: Diagnosis not present

## 2024-04-06 DIAGNOSIS — M7062 Trochanteric bursitis, left hip: Secondary | ICD-10-CM | POA: Diagnosis not present

## 2024-04-07 DIAGNOSIS — G8929 Other chronic pain: Secondary | ICD-10-CM | POA: Diagnosis not present

## 2024-04-07 DIAGNOSIS — E119 Type 2 diabetes mellitus without complications: Secondary | ICD-10-CM | POA: Diagnosis not present

## 2024-04-07 DIAGNOSIS — M457 Ankylosing spondylitis of lumbosacral region: Secondary | ICD-10-CM | POA: Diagnosis not present

## 2024-04-07 DIAGNOSIS — M5134 Other intervertebral disc degeneration, thoracic region: Secondary | ICD-10-CM | POA: Diagnosis not present

## 2024-04-07 DIAGNOSIS — M5442 Lumbago with sciatica, left side: Secondary | ICD-10-CM | POA: Diagnosis not present

## 2024-04-07 DIAGNOSIS — R29818 Other symptoms and signs involving the nervous system: Secondary | ICD-10-CM | POA: Diagnosis not present

## 2024-04-07 DIAGNOSIS — Z79899 Other long term (current) drug therapy: Secondary | ICD-10-CM | POA: Diagnosis not present

## 2024-04-09 DIAGNOSIS — Z79899 Other long term (current) drug therapy: Secondary | ICD-10-CM | POA: Diagnosis not present

## 2024-05-07 DIAGNOSIS — G8929 Other chronic pain: Secondary | ICD-10-CM | POA: Diagnosis not present

## 2024-05-07 DIAGNOSIS — I10 Essential (primary) hypertension: Secondary | ICD-10-CM | POA: Diagnosis not present

## 2024-05-07 DIAGNOSIS — R030 Elevated blood-pressure reading, without diagnosis of hypertension: Secondary | ICD-10-CM | POA: Diagnosis not present

## 2024-05-07 DIAGNOSIS — Z6838 Body mass index (BMI) 38.0-38.9, adult: Secondary | ICD-10-CM | POA: Diagnosis not present

## 2024-05-07 DIAGNOSIS — M5442 Lumbago with sciatica, left side: Secondary | ICD-10-CM | POA: Diagnosis not present

## 2024-05-07 DIAGNOSIS — M457 Ankylosing spondylitis of lumbosacral region: Secondary | ICD-10-CM | POA: Diagnosis not present

## 2024-05-07 DIAGNOSIS — E6609 Other obesity due to excess calories: Secondary | ICD-10-CM | POA: Diagnosis not present

## 2024-05-07 DIAGNOSIS — Z79899 Other long term (current) drug therapy: Secondary | ICD-10-CM | POA: Diagnosis not present

## 2024-05-07 DIAGNOSIS — M5134 Other intervertebral disc degeneration, thoracic region: Secondary | ICD-10-CM | POA: Diagnosis not present

## 2024-06-18 ENCOUNTER — Encounter (HOSPITAL_BASED_OUTPATIENT_CLINIC_OR_DEPARTMENT_OTHER): Payer: Self-pay

## 2024-06-18 ENCOUNTER — Emergency Department (HOSPITAL_BASED_OUTPATIENT_CLINIC_OR_DEPARTMENT_OTHER): Admission: EM | Admit: 2024-06-18 | Discharge: 2024-06-18 | Disposition: A | Discharge status: Home / Self Care

## 2024-06-18 ENCOUNTER — Emergency Department (HOSPITAL_BASED_OUTPATIENT_CLINIC_OR_DEPARTMENT_OTHER)

## 2024-06-18 ENCOUNTER — Other Ambulatory Visit: Payer: Self-pay

## 2024-06-18 DIAGNOSIS — J45909 Unspecified asthma, uncomplicated: Secondary | ICD-10-CM | POA: Diagnosis not present

## 2024-06-18 DIAGNOSIS — J111 Influenza due to unidentified influenza virus with other respiratory manifestations: Secondary | ICD-10-CM | POA: Insufficient documentation

## 2024-06-18 DIAGNOSIS — I10 Essential (primary) hypertension: Secondary | ICD-10-CM | POA: Diagnosis not present

## 2024-06-18 DIAGNOSIS — Z79899 Other long term (current) drug therapy: Secondary | ICD-10-CM | POA: Diagnosis not present

## 2024-06-18 DIAGNOSIS — E119 Type 2 diabetes mellitus without complications: Secondary | ICD-10-CM | POA: Diagnosis not present

## 2024-06-18 DIAGNOSIS — Z7984 Long term (current) use of oral hypoglycemic drugs: Secondary | ICD-10-CM | POA: Insufficient documentation

## 2024-06-18 DIAGNOSIS — R059 Cough, unspecified: Secondary | ICD-10-CM | POA: Diagnosis present

## 2024-06-18 LAB — CBC
HCT: 41.5 % (ref 36.0–46.0)
Hemoglobin: 13.7 g/dL (ref 12.0–15.0)
MCH: 29.4 pg (ref 26.0–34.0)
MCHC: 33 g/dL (ref 30.0–36.0)
MCV: 89.1 fL (ref 80.0–100.0)
Platelets: 189 K/uL (ref 150–400)
RBC: 4.66 MIL/uL (ref 3.87–5.11)
RDW: 12.9 % (ref 11.5–15.5)
WBC: 4.7 K/uL (ref 4.0–10.5)
nRBC: 0 % (ref 0.0–0.2)

## 2024-06-18 LAB — BASIC METABOLIC PANEL WITH GFR
Anion gap: 10 (ref 5–15)
BUN: 11 mg/dL (ref 8–23)
CO2: 31 mmol/L (ref 22–32)
Calcium: 9.3 mg/dL (ref 8.9–10.3)
Chloride: 99 mmol/L (ref 98–111)
Creatinine, Ser: 0.72 mg/dL (ref 0.44–1.00)
GFR, Estimated: >60 mL/min
Glucose, Bld: 112 mg/dL — ABNORMAL HIGH (ref 70–99)
Potassium: 3.6 mmol/L (ref 3.5–5.1)
Sodium: 140 mmol/L (ref 135–145)

## 2024-06-18 LAB — CK: Total CK: 69 U/L (ref 38–234)

## 2024-06-18 LAB — RESP PANEL BY RT-PCR (RSV, FLU A&B, COVID)  RVPGX2
Influenza A by PCR: NEGATIVE
Influenza B by PCR: POSITIVE — AB
Resp Syncytial Virus by PCR: NEGATIVE
SARS Coronavirus 2 by RT PCR: NEGATIVE

## 2024-06-18 MED ORDER — IPRATROPIUM-ALBUTEROL 0.5-2.5 (3) MG/3ML IN SOLN
3.0000 mL | Freq: Once | RESPIRATORY_TRACT | Status: AC
  Administered 2024-06-18: 3 mL via RESPIRATORY_TRACT
  Filled 2024-06-18: qty 3

## 2024-06-18 MED ORDER — LACTATED RINGERS IV BOLUS
1000.0000 mL | Freq: Once | INTRAVENOUS | Status: AC
  Administered 2024-06-18: 1000 mL via INTRAVENOUS

## 2024-06-18 MED ORDER — PREDNISONE 50 MG PO TABS: 60.0000 mg | ORAL_TABLET | Freq: Once | ORAL | Status: DC

## 2024-06-18 MED ORDER — METHYLPREDNISOLONE SODIUM SUCC 125 MG IJ SOLR
125.0000 mg | Freq: Once | INTRAMUSCULAR | Status: DC
  Filled 2024-06-18: qty 2

## 2024-06-18 NOTE — Discharge Instructions (Signed)
 You had the flu.  Treatment is supportive.  You may take over-the-counter medications such as Tylenol  every 6-8 hours for generalized malaise.  Try to maintain adequate hydration with frequent sips of water.  Please follow-up with your primary doctor.

## 2024-06-18 NOTE — ED Provider Notes (Signed)
 " [place] [facility] Provider Note   CSN: [id number] Arrival date & time: 06/18/24  1127     Patient presents with: Shortness of Breath   Christine Lynch is a 74 y.o. female.  {Add pertinent medical, surgical, social history, OB history to HPI:6871} 74 year old female presenting emergency department with flulike symptoms for almost a week.  Presented today for continued shortness of breath cough.  Cough is productive with yellow sputum.  Does have history of asthma inhalers not working.  No nausea no vomiting. Given Zpack and prednisone  by PCP with no improvement. No abd pain, N/V/D.    Shortness of Breath      Prior to Admission medications  Medication Sig Start Date End Date Taking? Authorizing Provider  albuterol  (PROVENTIL ) (2.5 MG/3ML) 0.083% nebulizer solution  01/11/19   [provider]  amLODipine (NORVASC) 5 MG tablet Take 5 mg by mouth daily. 03/09/19   [provider]  azithromycin  (ZITHROMAX ) 250 MG tablet Take 1 tablet (250 mg total) by mouth daily. Take first 2 tablets together, then 1 every day until finished. 03/27/22   Patt Alm Macho, MD  cyclobenzaprine (FLEXERIL) 10 MG tablet Take by mouth. 03/23/19   [provider]  diclofenac  Sodium (VOLTAREN ) 1 % GEL Apply 2 g topically in the morning and at bedtime. 05/11/23   Freddi Hamilton, MD  dorzolamide (TRUSOPT) 2 % ophthalmic solution Place 1 drop into both eyes 2 (two) times daily.    [provider]  furosemide (LASIX) 40 MG tablet Take 40 mg by mouth.    [provider]  metFORMIN (GLUCOPHAGE) 500 MG tablet  03/27/19   [provider]  mometasone (NASONEX) 50 MCG/ACT nasal spray Place 2 sprays into the nose daily.    [provider]  mupirocin ointment (BACTROBAN) 2 % Place 1 application into the nose 2 (two) times daily.    [provider]  ondansetron  (ZOFRAN ) 4 MG tablet Take 1 tablet (4 mg total) by mouth  every 6 (six) hours as needed for nausea or vomiting. 10/01/23   Elnor Hila P, DO  Oxycodone  HCl 10 MG TABS  03/16/19   [provider]  pantoprazole (PROTONIX) 40 MG tablet Take 40 mg by mouth daily.    [provider]  potassium chloride  (K-DUR) 10 MEQ tablet Take 10 mEq by mouth daily.    [provider]  promethazine  (PHENERGAN ) 25 MG tablet Take 1 tablet (25 mg total) by mouth every 8 (eight) hours as needed for nausea or vomiting. 10/01/23   Tegeler, Lonni PARAS, MD  valsartan (DIOVAN) 80 MG tablet Take 80 mg by mouth daily. 03/11/19   [provider]    Allergies: Baclofen, Fentanyl, Oxymorphone hcl, Amoxicillin-pot clavulanate, Oxymorphone, Sulfamethoxazole -trimethoprim , Promethazine , Ibuprofen, Sulfa  antibiotics, and Sulfamethoxazole     Review of Systems  Respiratory:  Positive for shortness of breath.     Updated Vital Signs BP (!) 114/92 (BP Location: Left Arm)   Pulse 85   Temp 99.5 F (37.5 C)   Resp 17   SpO2 96%   Physical Exam Vitals and nursing note reviewed.  Constitutional:      General: She is not in acute distress.    Appearance: She is obese. She is not toxic-appearing.  Cardiovascular:     Rate and Rhythm: Normal rate and regular rhythm.  Pulmonary:     Effort: Pulmonary effort is normal. No tachypnea or respiratory distress.     Breath sounds: Wheezing (faint)  present.  Musculoskeletal:     Cervical back: Normal range of motion.     Right lower leg: No edema.     Left lower leg: No edema.  Skin:    General: Skin is warm.     Capillary Refill: Capillary refill takes less than 2 seconds.  Neurological:     Mental Status: She is alert and oriented to person, place, and time.  Psychiatric:        Mood and Affect: Mood normal.        Behavior: Behavior normal.     (all labs ordered are listed, but only abnormal results are displayed) Labs Reviewed  RESP PANEL BY RT-PCR (RSV, FLU A&B, COVID)  RVPGX2 - Abnormal;  Notable for the following components:      Result Value   Influenza B by PCR POSITIVE (*)    All other components within normal limits  BASIC METABOLIC PANEL WITH GFR  CBC    EKG: None  Radiology: No results found.  {Document cardiac monitor, telemetry assessment procedure when appropriate:32947} Procedures   Medications Ordered in the ED  methylPREDNISolone  sodium succinate (SOLU-MEDROL ) 125 mg/2 mL injection 125 mg (has no administration in time range)  ipratropium-albuterol  (DUONEB) 0.5-2.5 (3) MG/3ML nebulizer solution 3 mL (3 mLs Nebulization Given 06/18/24 1201)    Clinical Course as of 06/18/24 1500  Fri Jun 18, 2024  1246 Influenza B By PCR(!): POSITIVE [TY]  1414 DG Chest 2 View IMPRESSION: No active cardiopulmonary disease.   [TY]  1457 CBC No leukocytosis to suggest systemic infection.  No anemia to explain her shortness of breath [TY]    Clinical Course User Index [TY] [name], DO   {Click here for ABCD2, HEART and other calculators REFRESH Note before signing:1}                              Medical Decision Making This is a 74 year old female past medical history to include asthma diabetes hypertension presenting to the emergency department with flulike symptoms complaining of worsening shortness of breath and cough.  She is flu positive which would explain most of her symptoms.  However she is outside of the indicated window for antiviral therapy given she is having symptoms for almost a week now.  Does have some wheezing on exam, not in overt distress.  Steroids and breathing treatment ordered.  Given her cough concern for possible pneumonia.  Chest x-ray ordered.  Also checking basic labs for evidence of dehydration.  Plan to reevaluate patient.  Amount and/or Complexity of Data Reviewed Labs: ordered. Decision-making details documented in ED Course. Radiology: ordered. Decision-making details documented in ED Course.  Risk Prescription drug  management.   {Document critical care time when appropriate  Document review of labs and clinical decision tools ie CHADS2VASC2, etc  Document your independent review of radiology images and any outside records  Document your discussion with family members, caretakers and with consultants  Document social determinants of health affecting pt's care  Document your decision making why or why not admission, treatments were needed:32947:::1}   Final diagnoses:  None    ED Discharge Orders     None        "

## 2024-06-18 NOTE — ED Triage Notes (Signed)
 Cough, congestion, body aches, fatigue, sweating for 1 week.  Increased WOB noted in triage   RRT in triage

## 2024-06-18 NOTE — ED Notes (Signed)
 Pt. Has been up multiple times to the restroom with assistance via w/c.  Pt. Has had multiple episodes of diarrhea with urination.
